# Patient Record
Sex: Male | Born: 1960 | Race: White | Hispanic: No | Marital: Married | Smoking: Never smoker
Health system: Southern US, Community
[De-identification: ages and names within clinical notes are randomized; demographics above are authoritative.]

## PROBLEM LIST (undated history)

## (undated) DIAGNOSIS — I10 Essential (primary) hypertension: Secondary | ICD-10-CM

## (undated) DIAGNOSIS — F329 Major depressive disorder, single episode, unspecified: Secondary | ICD-10-CM

## (undated) DIAGNOSIS — F32A Depression, unspecified: Secondary | ICD-10-CM

## (undated) DIAGNOSIS — E785 Hyperlipidemia, unspecified: Secondary | ICD-10-CM

## (undated) DIAGNOSIS — E119 Type 2 diabetes mellitus without complications: Secondary | ICD-10-CM

## (undated) DIAGNOSIS — I219 Acute myocardial infarction, unspecified: Secondary | ICD-10-CM

## (undated) DIAGNOSIS — F419 Anxiety disorder, unspecified: Secondary | ICD-10-CM

## (undated) HISTORY — DX: Depression, unspecified: F32.A

## (undated) HISTORY — DX: Hyperlipidemia, unspecified: E78.5

## (undated) HISTORY — DX: Type 2 diabetes mellitus without complications: E11.9

## (undated) HISTORY — PX: CORONARY ANGIOPLASTY WITH STENT PLACEMENT: SHX49

## (undated) HISTORY — DX: Acute myocardial infarction, unspecified: I21.9

## (undated) HISTORY — DX: Anxiety disorder, unspecified: F41.9

## (undated) HISTORY — DX: Essential (primary) hypertension: I10

## (undated) HISTORY — PX: TONSILLECTOMY: SHX5217

---
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
## 2018-01-23 LAB — HEMOGLOBIN A1C: Hemoglobin A1C: 9.7

## 2018-01-30 ENCOUNTER — Encounter: Payer: Self-pay | Admitting: Gastroenterology

## 2018-03-03 ENCOUNTER — Ambulatory Visit (INDEPENDENT_AMBULATORY_CARE_PROVIDER_SITE_OTHER): Payer: Self-pay

## 2018-03-03 DIAGNOSIS — Z1211 Encounter for screening for malignant neoplasm of colon: Secondary | ICD-10-CM

## 2018-03-03 MED ORDER — NA SULFATE-K SULFATE-MG SULF 17.5-3.13-1.6 GM/177ML PO SOLN: 1.0000 | ORAL | 0 refills | Status: DC

## 2018-03-03 NOTE — Patient Instructions (Addendum)
Matthew Lawson  17-Dec-1960 MRN: [id number]     Procedure Date: 07/29/2018 Time to register: 6:30am Place to register: [facility] Procedure Time: 7:30am Scheduled provider: R. Garfield Cornea, MD    PREPARATION FOR COLONOSCOPY WITH SUPREP BOWEL PREP KIT  Note: Suprep Bowel Prep Kit is a split-dose (2day) regimen. Consumption of BOTH 6-ounce bottles is required for a complete prep.  Please notify us immediately if you are diabetic, take iron supplements, or if you are on Coumadin or any other blood thinners.  Please hold the following medications: I will mail you a letter with this information                                                                                                                                                   2 DAYS BEFORE PROCEDURE:  DATE: 07/27/2018   DAY: Monday Begin clear liquid diet AFTER your lunch meal. NO SOLID FOODS after this point.  1 DAY BEFORE PROCEDURE:  DATE: 07/28/2018  DAY: Tuesday Continue clear liquids the entire day - NO SOLID FOOD.   Diabetic medications adjustments for today:   On 07/28/2018- only take half your normal dosage of Tresiba  At 6:00pm: Complete steps 1 through 4 below, using ONE (1) 6-ounce bottle, before going to bed. Step 1:  Pour ONE (1) 6-ounce bottle of SUPREP liquid into the mixing container.  Step 2:  Add cool drinking water to the 16 ounce line on the container and mix.  Note: Dilute the solution concentrate as directed prior to use. Step 3:  DRINK ALL the liquid in the container. Step 4:  You MUST drink an additional two (2) or more 16 ounce containers of water over the next one (1) hour.   Continue clear liquids.  DAY OF PROCEDURE:   DATE: 07/29/2018   DAY: Wednesday If you take medications for your heart, blood pressure, or breathing, you may take these medications.  Diabetic medications adjustments for today: On 07/29/2018- do not take any diabetes medication the morning of your colonoscopy.  5 hours before  your procedure at :2:30am Step 1:  Pour ONE (1) 6-ounce bottle of SUPREP liquid into the mixing container.  Step 2:  Add cool drinking water to the 16 ounce line on the container and mix.  Note: Dilute the solution concentrate as directed prior to use. Step 3:  DRINK ALL the liquid in the container. Step 4:  You MUST drink an additional two (2) or more 16 ounce containers of water over the next one (1) hour. You MUST complete the final glass of water at least 3 hours before your colonoscopy.   Nothing by mouth past 4:30am  You may take your morning medications with sip of water unless we have instructed otherwise.    Please see below for Dietary Information.  CLEAR LIQUIDS INCLUDE:  Water Jello (NOT red in color)   Ice Popsicles (NOT red in color)   Tea (sugar ok, no milk/cream) Powdered fruit flavored drinks  Coffee (sugar ok, no milk/cream) Gatorade/ Lemonade/ Kool-Aid  (NOT red in color)   Juice: apple, white grape, white cranberry Soft drinks  Clear bullion, consomme, broth (fat free beef/chicken/vegetable)  Carbonated beverages (any kind)  Strained chicken noodle soup Hard Candy   Remember: Clear liquids are liquids that will allow you to see your fingers on the other side of a clear glass. Be sure liquids are NOT red in color, and not cloudy, but CLEAR.  DO NOT EAT OR DRINK ANY OF THE FOLLOWING:  Dairy products of any kind   Cranberry juice Tomato juice / V8 juice   Grapefruit juice Orange juice     Red grape juice  Do not eat any solid foods, including such foods as: cereal, oatmeal, yogurt, fruits, vegetables, creamed soups, eggs, bread, crackers, pureed foods in a blender, etc.   HELPFUL HINTS FOR DRINKING PREP SOLUTION:   Make sure prep is extremely cold. Mix and refrigerate the the morning of the prep. You may also put in the freezer.   You may try mixing some Crystal Light or Country Time Lemonade if you prefer. Mix in small amounts; add more if necessary.  Try  drinking through a straw  Rinse mouth with water or a mouthwash between glasses, to remove after-taste.  Try sipping on a cold beverage /ice/ popsicles between glasses of prep.  Place a piece of sugar-free hard candy in mouth between glasses.  If you become nauseated, try consuming smaller amounts, or stretch out the time between glasses. Stop for 30-60 minutes, then slowly start back drinking.     OTHER INSTRUCTIONS  You will need a responsible adult at least 56 years of age to accompany you and drive you home. This person must remain in the waiting room during your procedure. The hospital will cancel your procedure if you do not have a responsible adult with you.   1. Wear loose fitting clothing that is easily removed. 2. Leave jewelry and other valuables at home.  3. Remove all body piercing jewelry and leave at home. 4. Total time from sign-in until discharge is approximately 2-3 hours. 5. You should go home directly after your procedure and rest. You can resume normal activities the day after your procedure. 6. The day of your procedure you should not:  Drive  Make legal decisions  Operate machinery  Drink alcohol  Return to work   You may call the office (Dept: [phone]) before 5:00pm, or page the doctor on call [phone]) after 5:00pm, for further instructions, if necessary.   Insurance Information YOU WILL NEED TO CHECK WITH YOUR INSURANCE COMPANY FOR THE BENEFITS OF COVERAGE YOU HAVE FOR THIS PROCEDURE.  UNFORTUNATELY, NOT ALL INSURANCE COMPANIES HAVE BENEFITS TO COVER ALL OR PART OF THESE TYPES OF PROCEDURES.  IT IS YOUR RESPONSIBILITY TO CHECK YOUR BENEFITS, HOWEVER, WE WILL BE GLAD TO ASSIST YOU WITH ANY CODES YOUR INSURANCE COMPANY MAY NEED.    PLEASE NOTE THAT MOST INSURANCE COMPANIES WILL NOT COVER A SCREENING COLONOSCOPY FOR PEOPLE UNDER THE AGE OF 50  IF YOU HAVE BCBS INSURANCE, YOU MAY HAVE BENEFITS FOR A SCREENING COLONOSCOPY BUT IF POLYPS ARE  FOUND THE DIAGNOSIS WILL CHANGE AND THEN YOU MAY HAVE A DEDUCTIBLE THAT WILL NEED TO BE MET. SO PLEASE MAKE SURE YOU CHECK YOUR BENEFITS FOR A SCREENING COLONOSCOPY AS WELL  AS A DIAGNOSTIC COLONOSCOPY.

## 2018-03-03 NOTE — Progress Notes (Signed)
Gastroenterology Pre-Procedure Review  Request Date:03/03/18 Requesting Physician: [facility]- Pushpinder Delane Ginger NP- no previous tcs  PATIENT REVIEW QUESTIONS: The patient responded to the following health history questions as indicated:    1. Diabetes Melitis: yes, glipizide qd, metformin bid, tresiba qhs, steglatro qd 2. Joint replacements in the past 12 months: no 3. Major health problems in the past 3 months: no 4. Has an artificial valve or MVP: no 5. Has a defibrillator: no 6. Has been advised in past to take antibiotics in advance of a procedure like teeth cleaning: no 7. Family history of colon cancer: no  8. Alcohol Use: no 9. History of sleep apnea: no  10. History of coronary artery or other vascular stents placed within the last 12 months: no, pt had a stent placed 18 months ago and stopped Brilinta 3 months ago.  11. History of any prior anesthesia complications: no    MEDICATIONS & ALLERGIES:    Patient reports the following regarding taking any blood thinners:   Plavix? no Aspirin? yes (81mg ) Coumadin? no Brilinta? no Xarelto? no Eliquis? no Pradaxa? no Savaysa? no Effient? no  Patient confirms/reports the following medications:  Current Outpatient Medications  Medication Sig Dispense Refill  . amLODipine (NORVASC) 5 MG tablet daily.    Marland Kitchen aspirin EC 81 MG tablet Take 81 mg by mouth daily.    Marland Kitchen atorvastatin (LIPITOR) 40 MG tablet at bedtime.    . Cholecalciferol (VITAMIN D3) 10 MCG (400 UNIT) CAPS Take by mouth.    . escitalopram (LEXAPRO) 20 MG tablet daily.    Marland Kitchen glipiZIDE (GLUCOTROL XL) 10 MG 24 hr tablet daily.    . Magnesium 500 MG CAPS Take by mouth daily.    . metFORMIN (GLUCOPHAGE) 1000 MG tablet 2 (two) times daily.    . metoprolol tartrate (LOPRESSOR) 25 MG tablet 12.5 mg 2 (two) times daily.    Marland Kitchen STEGLATRO 5 MG TABS daily.    Evaristo Bury FLEXTOUCH 200 UNIT/ML SOPN 24 Units at bedtime.     No current facility-administered medications for this  visit.     Patient confirms/reports the following allergies:  No Known Allergies  No orders of the defined types were placed in this encounter.   AUTHORIZATION INFORMATION Primary Insurance: [place],  [place] #: [id number] Pre-Cert / Berkley Harvey required: no   SCHEDULE INFORMATION: Procedure has been scheduled as follows:  Date: 05/20/18, Time: 7:30 Location: [facility]   This Gastroenterology Pre-Precedure Review Form is being routed to the following provider(s): Lewie Loron NP

## 2018-03-04 NOTE — Addendum Note (Signed)
Addended by: Myra Rude on: 03/04/2018 01:56 PM   Modules accepted: Orders, SmartSet

## 2018-03-09 NOTE — Progress Notes (Signed)
Take 1/2 dose of Tresiba evening prior to colonoscopy. No glipizide, metformin, or steglatro day of procedure.

## 2018-03-10 NOTE — Progress Notes (Signed)
Letter mailed to the pt with DM instructions 

## 2018-05-05 NOTE — Progress Notes (Signed)
Pt's procedure was re-scheduled to 07/29/2018 due to COVID 19.  Pt aware that we are mailing out new instructions including any diabetic medication adjustments.  Eber Jones in Endo notified.

## 2018-06-29 LAB — HEMOGLOBIN A1C: Hemoglobin A1C: 10

## 2018-07-20 ENCOUNTER — Telehealth: Payer: Self-pay | Admitting: *Deleted

## 2018-07-20 NOTE — Telephone Encounter (Addendum)
Called pt and left voice mail on work number for pt to call us back so we can schedule his COVID 19 screening.  VM is not set up on his home number.

## 2018-07-21 NOTE — Telephone Encounter (Addendum)
Lmom on his work number for pt to call me back today.  VM is not set up on his home phone.

## 2018-07-22 NOTE — Telephone Encounter (Signed)
Called pt's home number but there is no vm set up so unable to leave a message.  Left another vm on pt's work phone number for pt to call me back ASAP.

## 2018-07-23 ENCOUNTER — Telehealth: Payer: Self-pay | Admitting: *Deleted

## 2018-07-23 NOTE — Telephone Encounter (Signed)
Left message with sister for pt to call us back.  Trying to schedule pt for his COVID 19 screening.

## 2018-07-23 NOTE — Telephone Encounter (Signed)
Called pt's home phone number again and left a message with a gentleman for pt to call us back today.

## 2018-07-27 ENCOUNTER — Telehealth: Payer: Self-pay | Admitting: Gastroenterology

## 2018-07-27 NOTE — Telephone Encounter (Signed)
Called endo and cancelled procedure. fowarding to [facility]

## 2018-07-27 NOTE — Telephone Encounter (Signed)
[phone]  PATIENT WIFE CALLED TO CANCEL HIS PROCEDURE, HE WILL CALL BACK AFTER HIS WORK SCHEDULE GETS BETTER

## 2018-07-29 ENCOUNTER — Ambulatory Visit (HOSPITAL_COMMUNITY)
Admission: RE | Admit: 2018-07-29 | Payer: BC Managed Care – PPO | Source: Home / Self Care | Admitting: Internal Medicine

## 2018-07-29 ENCOUNTER — Encounter (HOSPITAL_COMMUNITY): Admission: RE | Payer: Self-pay | Source: Home / Self Care

## 2018-07-29 SURGERY — COLONOSCOPY
Anesthesia: Moderate Sedation

## 2018-08-03 NOTE — Telephone Encounter (Signed)
Noted  

## 2018-09-07 ENCOUNTER — Ambulatory Visit (INDEPENDENT_AMBULATORY_CARE_PROVIDER_SITE_OTHER): Payer: BC Managed Care – PPO | Admitting: "Endocrinology

## 2018-09-07 ENCOUNTER — Encounter: Payer: Self-pay | Admitting: "Endocrinology

## 2018-09-07 ENCOUNTER — Other Ambulatory Visit: Payer: Self-pay

## 2018-09-07 VITALS — BP 116/77 | HR 64 | Ht 68.0 in | Wt 177.0 lb

## 2018-09-07 DIAGNOSIS — I10 Essential (primary) hypertension: Secondary | ICD-10-CM

## 2018-09-07 DIAGNOSIS — E1159 Type 2 diabetes mellitus with other circulatory complications: Secondary | ICD-10-CM

## 2018-09-07 DIAGNOSIS — E782 Mixed hyperlipidemia: Secondary | ICD-10-CM | POA: Diagnosis not present

## 2018-09-07 NOTE — Patient Instructions (Signed)

## 2018-09-07 NOTE — Progress Notes (Signed)
Endocrinology Consult Note       09/07/2018, 5:50 PM   Subjective:    Patient ID: Matthew Lawson, male    DOB: 02/22/1960.  Matthew Lawson is being seen in consultation for management of currently uncontrolled symptomatic diabetes requested by  Sander Radon, NP.   Past Medical History:  Diagnosis Date  . Anxiety   . Depression   . Diabetes mellitus, type II ([place])   . Heart attack ([place])   . Hyperlipidemia   . Hypertension     Past Surgical History:  Procedure Laterality Date  . CORONARY ANGIOPLASTY WITH STENT PLACEMENT    . TONSILLECTOMY      Social History   Socioeconomic History  . Marital status: Married    Spouse name: Not on file  . Number of children: Not on file  . Years of education: Not on file  . Highest education level: Not on file  Occupational History  . Not on file  Social Needs  . Financial resource strain: Not on file  . Food insecurity    Worry: Not on file    Inability: Not on file  . Transportation needs    Medical: Not on file    Non-medical: Not on file  Tobacco Use  . Smoking status: Never Smoker  . Smokeless tobacco: Never Used  Substance and Sexual Activity  . Alcohol use: Never    Frequency: Never  . Drug use: Never  . Sexual activity: Not on file  Lifestyle  . Physical activity    Days per week: Not on file    Minutes per session: Not on file  . Stress: Not on file  Relationships  . Social Herbalist on phone: Not on file    Gets together: Not on file    Attends religious service: Not on file    Active member of club or organization: Not on file    Attends meetings of clubs or organizations: Not on file    Relationship status: Not on file  Other Topics Concern  . Not on file  Social History Narrative  . Not on file    Family History  Problem Relation Age of Onset  . Diabetes Mother   . Hypertension Mother   . Hyperlipidemia  Mother   . Hypertension Father   . Diabetes Father   . Hyperlipidemia Father   . CAD Father     Outpatient Encounter Medications as of 09/07/2018  Medication Sig  . amLODipine (NORVASC) 5 MG tablet daily.  Marland Kitchen aspirin EC 81 MG tablet Take 81 mg by mouth daily.  Marland Kitchen atorvastatin (LIPITOR) 40 MG tablet at bedtime.  . Cholecalciferol (VITAMIN D3) 10 MCG (400 UNIT) CAPS Take by mouth.  . Magnesium 500 MG CAPS Take by mouth daily.  . metFORMIN (GLUCOPHAGE) 1000 MG tablet 2 (two) times daily.  . metoprolol tartrate (LOPRESSOR) 25 MG tablet 12.5 mg 2 (two) times daily.  . TRESIBA FLEXTOUCH 200 UNIT/ML SOPN Inject 30 Units into the skin at bedtime.  . [DISCONTINUED] glipiZIDE (GLUCOTROL XL) 10 MG 24 hr tablet daily.  . [DISCONTINUED] STEGLATRO 5 MG TABS  daily.  . [DISCONTINUED] escitalopram (LEXAPRO) 20 MG tablet daily.  . [DISCONTINUED] Na Sulfate-K Sulfate-Mg Sulf (SUPREP BOWEL PREP KIT) 17.5-3.13-1.6 GM/177ML SOLN Take 1 kit by mouth as directed.   No facility-administered encounter medications on file as of 09/07/2018.     ALLERGIES: No Known Allergies  VACCINATION STATUS:  There is no immunization history on file for this patient.  Diabetes He presents for his initial diabetic visit. He has type 2 diabetes mellitus. Onset time: He was diagnosed at approximate age of 43 years. His disease course has been worsening. Hypoglycemia symptoms include nervousness/anxiousness, sweats and tremors. Pertinent negatives for hypoglycemia include no confusion, headaches, pallor or seizures. Associated symptoms include blurred vision, polydipsia, polyuria and weight loss. Pertinent negatives for diabetes include no chest pain, no fatigue, no polyphagia and no weakness. Hypoglycemia complications include nocturnal hypoglycemia. Symptoms are worsening. Diabetic complications include heart disease. Risk factors for coronary artery disease include diabetes mellitus, dyslipidemia, family history, hypertension and  sedentary lifestyle. Current diabetic treatments: He is currently on Tresiba 24 units nightly, metformin 1000 mg p.o. twice daily, glipizide 10 mg p.o. daily, andSteglatro 5 mg p.o. daily. His weight is decreasing steadily. He is following a generally unhealthy diet. When asked about meal planning, he reported none. He has not had a previous visit with a dietitian. He never participates in exercise. (He did not bring any logs nor meter to review.  He reports significant fluctuation in his blood glucose profile including hypoglycemia.  His most recent A1c was 10% on June 29, 2018. ) An ACE inhibitor/angiotensin II receptor blocker is not being taken. Eye exam is current.  Hyperlipidemia This is a chronic problem. The current episode started more than 1 year ago. Exacerbating diseases include diabetes. Pertinent negatives include no chest pain, myalgias or shortness of breath. Current antihyperlipidemic treatment includes statins. Risk factors for coronary artery disease include diabetes mellitus, dyslipidemia, hypertension, male sex and a sedentary lifestyle.  Hypertension This is a chronic problem. The current episode started more than 1 year ago. Associated symptoms include blurred vision and sweats. Pertinent negatives include no chest pain, headaches, neck pain, palpitations or shortness of breath. Risk factors for coronary artery disease include diabetes mellitus, dyslipidemia, male gender, sedentary lifestyle and family history. Past treatments include calcium channel blockers. Hypertensive end-organ damage includes CAD/MI.     Review of Systems  Constitutional: Positive for weight loss. Negative for chills, fatigue, fever and unexpected weight change.  HENT: Negative for dental problem, mouth sores and trouble swallowing.   Eyes: Positive for blurred vision. Negative for visual disturbance.  Respiratory: Negative for cough, choking, chest tightness, shortness of breath and wheezing.    Cardiovascular: Negative for chest pain, palpitations and leg swelling.  Gastrointestinal: Negative for abdominal distention, abdominal pain, constipation, diarrhea, nausea and vomiting.  Endocrine: Positive for polydipsia and polyuria. Negative for polyphagia.  Genitourinary: Negative for dysuria, flank pain, hematuria and urgency.  Musculoskeletal: Negative for back pain, gait problem, myalgias and neck pain.  Skin: Negative for pallor, rash and wound.  Neurological: Positive for tremors. Negative for seizures, syncope, weakness, numbness and headaches.  Psychiatric/Behavioral: Negative for confusion and dysphoric mood. The patient is nervous/anxious.     Objective:    BP 116/77   Pulse 64   Ht '5\' 8"'$  (1.727 m)   Wt 177 lb (80.3 kg)   BMI 26.91 kg/m   Wt Readings from Last 3 Encounters:  09/07/18 177 lb (80.3 kg)     Physical Exam Constitutional:  General: He is not in acute distress.    Appearance: He is well-developed.  HENT:     Head: Normocephalic and atraumatic.  Neck:     Musculoskeletal: Normal range of motion and neck supple.     Thyroid: No thyromegaly.     Trachea: No tracheal deviation.  Cardiovascular:     Rate and Rhythm: Normal rate.     Pulses:          Dorsalis pedis pulses are 1+ on the right side and 1+ on the left side.       Posterior tibial pulses are 1+ on the right side and 1+ on the left side.     Heart sounds: S1 normal and S2 normal. No murmur. No gallop.   Pulmonary:     Effort: Pulmonary effort is normal. No respiratory distress.     Breath sounds: No wheezing.  Abdominal:     General: There is no distension.     Tenderness: There is no abdominal tenderness. There is no guarding.  Musculoskeletal:     Right shoulder: He exhibits no swelling and no deformity.  Skin:    General: Skin is warm and dry.     Findings: No rash.     Nails: There is no clubbing.   Neurological:     Mental Status: He is alert and oriented to person, place,  and time.     Cranial Nerves: No cranial nerve deficit.     Sensory: No sensory deficit.     Gait: Gait normal.     Deep Tendon Reflexes: Reflexes are normal and symmetric.  Psychiatric:        Speech: Speech normal.        Behavior: Behavior normal. Behavior is cooperative.        Thought Content: Thought content normal.        Judgment: Judgment normal.     Recent Results (from the past 2160 hour(s))  Hemoglobin A1c     Status: None   Collection Time: 06/29/18 12:00 AM  Result Value Ref Range   Hemoglobin A1C 10      Assessment & Plan:   1. DM type 2 causing vascular disease ([place])  - Matthew Lawson has currently uncontrolled symptomatic type 2 DM since  58 years of age,  with most recent A1c of 10 %. Recent labs reviewed. - I had a long discussion with him about the progressive nature of diabetes and the pathology behind its complications. -his diabetes is complicated by CAD and he remains at a high risk for more acute and chronic complications which include CAD, CVA, CKD, retinopathy, and neuropathy. These are all discussed in detail with him.  - I have counseled him on diet management adopting a carbohydrate restricted/protein rich diet. - he admits that there is a room for improvement in his food and drink choices. - Suggestion is made for him to avoid simple carbohydrates  from his diet including Cakes, Sweet Desserts, Ice Cream, Soda (diet and regular), Sweet Tea, Candies, Chips, Cookies, Store Bought Juices, Alcohol in Excess of  1-2 drinks a day, Artificial Sweeteners,  Coffee Creamer, and "Sugar-free" Products. This will help patient to have more stable blood glucose profile and potentially avoid unintended weight gain.  - I encouraged him to switch to  unprocessed or minimally processed complex starch and increased protein intake (animal or plant source), fruits, and vegetables.  - he is advised to stick to a routine mealtimes to eat 3 meals  a day and avoid unnecessary  snacks ( to snack only to correct hypoglycemia).   - he will be scheduled with Jearld Fenton, RDN, CDE for diabetes education.  - I have approached him with the following individualized plan to manage  his diabetes and patient agrees:   -Patient with questionable cognitive ability, on a particularly complicated insulin and oral medication regimen, at risk for hypoglycemia from inadvertent use. -I discussed and increased his Tresiba to 30 units nightly,  associated with strict monitoring of glucose 4 times a day-before meals and at bedtime. - he is warned not to take insulin without proper monitoring per orders.  - he is encouraged to call clinic for blood glucose levels less than 70 or above 300 mg /dl. - he is advised to continue metformin 1000 mg p.o. twice daily, therapeutically suitable for patient . - his Steglatro will be discontinued, given his unexplained significant weight loss from 220 pounds to 177 pounds, decrease his risk of euglycemic DKA.    -His report of hypoglycemia significant, will be taken off of glipizide for now. -He is not a suitable candidate for incretin therapy.  - Patient specific target  A1c;  LDL, HDL, Triglycerides, and  Waist Circumference were discussed in detail.  2) Blood Pressure /Hypertension:  his blood pressure is  controlled to target.   he is advised to continue his current medications including amlodipine 5 mg p.o. daily with breakfast , and metoprolol 25 mg p.o. twice daily. 3) Lipids/Hyperlipidemia: No recent lipid panel to review.  He is on atorvastatin 40 mg p.o. nightly.  he  is advised to continue , Side effects and precautions discussed with him.  4)  Weight/Diet:  Body mass index is 26.91 kg/m.  -He is not a candidate for major weight loss measures.  He will benefit from diabetes education.  CDE Consult will be initiated . Exercise, and detailed carbohydrates information provided  -  detailed on discharge instructions.  5) Chronic  Care/Health Maintenance:  -he  is on Statin medications and  is encouraged to initiate and continue to follow up with Ophthalmology, Dentist,  Podiatrist at least yearly or according to recommendations, and advised to  stay away from smoking. I have recommended yearly flu vaccine and pneumonia vaccine at least every 5 years; moderate intensity exercise for up to 150 minutes weekly; and  sleep for at least 7 hours a day.  - he is  advised to maintain close follow up with Sander Radon, NP for primary care needs, as well as his other providers for optimal and coordinated care.  - Time spent with the patient: 45 minutes, of which >50% was spent in obtaining information about his symptoms, reviewing his previous labs/studies, evaluations, and treatments, counseling him about his currently uncontrolled type 2 diabetes, hyperlipidemia, hypertension, and developing plans for long term treatment based on the latest standards of care/guidelines.  Please refer to " Patient Self Inventory" in the Media  tab for reviewed elements of pertinent patient history.  Matthew Lawson participated in the discussions, expressed understanding, and voiced agreement with the above plans.  All questions were answered to his satisfaction. he is encouraged to contact clinic should he have any questions or concerns prior to his return visit.  Follow up plan: - Return in about 10 days (around 09/17/2018), or in person if possible, for Follow up with Meter and Logs Only - no Labs.  Glade Lloyd, MD [facility] [address]  [place], [place] [address] Phone: [phone]  Fax: [phone]    09/07/2018, 5:50 PM  This note was partially dictated with voice recognition software. Similar sounding words can be transcribed inadequately or may not  be corrected upon review.

## 2018-09-14 ENCOUNTER — Other Ambulatory Visit: Payer: Self-pay | Admitting: "Endocrinology

## 2018-09-14 ENCOUNTER — Telehealth: Payer: Self-pay | Admitting: "Endocrinology

## 2018-09-14 MED ORDER — GLIPIZIDE ER 5 MG PO TB24: 5.0000 mg | ORAL_TABLET | Freq: Every day | ORAL | 3 refills | Status: DC

## 2018-09-14 NOTE — Telephone Encounter (Signed)
Advise to increase tresiba to 40 units qhs, continue MTF 1000 mg po BID, resuming glipizide 5 mg XL daily at [facility] ( will send in a rx).

## 2018-09-14 NOTE — Telephone Encounter (Signed)
Patient's sister, Amy called back and said his reading  Saturday morning it was 212 Saturday before lunch it was 254 Saturday before supper it was 237 Saturday night it was 498.   Yesterday morning it was 145, before lunch it was 260 and before supper it was 261. At bedtime it was 315.

## 2018-09-14 NOTE — Telephone Encounter (Signed)
Lft msg with both pt and sister to call back

## 2018-09-14 NOTE — Telephone Encounter (Signed)
Pt's sister notified

## 2018-09-14 NOTE — Telephone Encounter (Signed)
Patient's sister called and left Vm that he is having readings over 200. She said that one time it was over 300 & another time it was over 400. She can be reached at [phone]

## 2018-09-17 ENCOUNTER — Other Ambulatory Visit: Payer: Self-pay

## 2018-09-17 ENCOUNTER — Ambulatory Visit (INDEPENDENT_AMBULATORY_CARE_PROVIDER_SITE_OTHER): Payer: BC Managed Care – PPO | Admitting: "Endocrinology

## 2018-09-17 ENCOUNTER — Encounter: Payer: Self-pay | Admitting: "Endocrinology

## 2018-09-17 DIAGNOSIS — I10 Essential (primary) hypertension: Secondary | ICD-10-CM | POA: Diagnosis not present

## 2018-09-17 DIAGNOSIS — E1159 Type 2 diabetes mellitus with other circulatory complications: Secondary | ICD-10-CM | POA: Diagnosis not present

## 2018-09-17 DIAGNOSIS — E782 Mixed hyperlipidemia: Secondary | ICD-10-CM

## 2018-09-17 NOTE — Progress Notes (Signed)
09/17/2018, 5:27 PM                                                    Endocrinology Telehealth Visit Follow up Note -During COVID -19 Pandemic  This visit type was conducted due to national recommendations for restrictions regarding the COVID-19 Pandemic  in an effort to limit this patient's exposure and mitigate transmission of the corona virus.  Due to his co-morbid illnesses, Matthew Lawson is at  moderate to high risk for complications without adequate follow up.  This format is felt to be most appropriate for him at this time.  I connected with this patient on 09/17/2018   by telephone and verified that I am speaking with the correct person using two identifiers. Matthew Lawson, 08/22/1960. he has verbally consented to this visit. All issues noted in this document were discussed and addressed. The format was not optimal for physical exam.    Subjective:    Patient ID: Matthew Lawson, male    DOB: 02/10/1960.  Matthew Lawson is being engaged in telehealth via telephone for management of currently uncontrolled symptomatic diabetes requested by  Alisia FerrariGill, Pushpinder K, NP.   Past Medical History:  Diagnosis Date  . Anxiety   . Depression   . Diabetes mellitus, type II (HCC)   . Heart attack (HCC)   . Hyperlipidemia   . Hypertension     Past Surgical History:  Procedure Laterality Date  . CORONARY ANGIOPLASTY WITH STENT PLACEMENT    . TONSILLECTOMY      Social History   Socioeconomic History  . Marital status: Married    Spouse name: Not on file  . Number of children: Not on file  . Years of education: Not on file  . Highest education level: Not on file  Occupational History  . Not on file  Social Needs  . Financial resource strain: Not on file  . Food insecurity    Worry: Not on file    Inability: Not on file  . Transportation needs    Medical: Not on file    Non-medical: Not on file  Tobacco Use  .  Smoking status: Never Smoker  . Smokeless tobacco: Never Used  Substance and Sexual Activity  . Alcohol use: Never    Frequency: Never  . Drug use: Never  . Sexual activity: Not on file  Lifestyle  . Physical activity    Days per week: Not on file    Minutes per session: Not on file  . Stress: Not on file  Relationships  . Social Musicianconnections    Talks on phone: Not on file    Gets together: Not on file    Attends religious service: Not on file    Active member of club or organization: Not on file    Attends meetings of clubs or organizations: Not on file    Relationship status: Not on file  Other Topics Concern  . Not on file  Social History Narrative  . Not on file    Family History  Problem Relation Age of Onset  . Diabetes Mother   . Hypertension Mother   . Hyperlipidemia Mother   . Hypertension Father   . Diabetes Father   . Hyperlipidemia Father   . CAD Father     Outpatient Encounter Medications as of 09/17/2018  Medication Sig  . amLODipine (NORVASC) 5 MG tablet daily.  Marland Kitchen aspirin EC 81 MG tablet Take 81 mg by mouth daily.  Marland Kitchen atorvastatin (LIPITOR) 40 MG tablet at bedtime.  . Cholecalciferol (VITAMIN D3) 10 MCG (400 UNIT) CAPS Take by mouth.  Marland Kitchen glipiZIDE (GLUCOTROL XL) 5 MG 24 hr tablet Take 1 tablet (5 mg total) by mouth daily with breakfast.  . Magnesium 500 MG CAPS Take by mouth daily.  . metFORMIN (GLUCOPHAGE) 1000 MG tablet 2 (two) times daily.  . metoprolol tartrate (LOPRESSOR) 25 MG tablet 12.5 mg 2 (two) times daily.  . TRESIBA FLEXTOUCH 200 UNIT/ML SOPN Inject 30 Units into the skin at bedtime.   No facility-administered encounter medications on file as of 09/17/2018.     ALLERGIES: No Known Allergies  VACCINATION STATUS:  There is no immunization history on file for this patient.  Diabetes He presents for his follow-up diabetic visit. He has type 2 diabetes mellitus. Onset time: He was diagnosed at approximate age of 53 years. His disease  course has been improving. Hypoglycemia symptoms include nervousness/anxiousness, sweats and tremors. Pertinent negatives for hypoglycemia include no confusion, headaches, pallor or seizures. Associated symptoms include blurred vision, polydipsia, polyuria and weight loss. Pertinent negatives for diabetes include no chest pain, no fatigue, no polyphagia and no weakness. Hypoglycemia complications include nocturnal hypoglycemia. Symptoms are improving. Diabetic complications include heart disease. Risk factors for coronary artery disease include diabetes mellitus, dyslipidemia, family history, hypertension and sedentary lifestyle. Current diabetic treatments: He is currently on Tresiba 40 units nightly, metformin 1000 mg p.o. twice daily, glipizide 5 mg p.o. daily. He is following a generally unhealthy diet. When asked about meal planning, he reported none. He has not had a previous visit with a dietitian. He never participates in exercise. His home blood glucose trend is decreasing steadily. His breakfast blood glucose range is generally 110-130 mg/dl. His lunch blood glucose range is generally 140-180 mg/dl. His dinner blood glucose range is generally 140-180 mg/dl. His bedtime blood glucose range is generally 140-180 mg/dl. His overall blood glucose range is 140-180 mg/dl. (He has seen significant improvement in his glycemic profile, including less frequent hypoglycemia.) An ACE inhibitor/angiotensin II receptor blocker is not being taken. Eye exam is current.  Hyperlipidemia This is a chronic problem. The current episode started more than 1 year ago. Exacerbating diseases include diabetes. Pertinent negatives include no chest pain, myalgias or shortness of breath. Current antihyperlipidemic treatment includes statins. Risk factors for coronary artery disease include diabetes mellitus, dyslipidemia, hypertension, male sex and a sedentary lifestyle.  Hypertension This is a chronic problem. The current episode  started more than 1 year ago. Associated symptoms include blurred vision and sweats. Pertinent negatives include no chest pain, headaches, neck pain, palpitations or shortness of breath. Risk factors for coronary artery disease include diabetes mellitus, dyslipidemia, male gender, sedentary lifestyle and family history. Past treatments include calcium channel blockers. Hypertensive end-organ damage includes CAD/MI.    Review of systems: Limited as above.   Objective:    There were no vitals taken for this visit.  Wt Readings from Last 3 Encounters:  09/07/18  177 lb (80.3 kg)       Recent Results (from the past 2160 hour(s))  Hemoglobin A1c     Status: None   Collection Time: 06/29/18 12:00 AM  Result Value Ref Range   Hemoglobin A1C 10      Assessment & Plan:   1. DM type 2 causing vascular disease (HCC)  - Aithen Bein has currently uncontrolled symptomatic type 2 DM since  58 years of age,  with most recent A1c of 10 %. Recent labs reviewed. -He is reporting new target glycemic profile, much less frequent hypoglycemia compared to prior to his last visit . this is despite his recent A1c of 10%. - I had a long discussion with him about the progressive nature of diabetes and the pathology behind its complications. -his diabetes is complicated by CAD and he remains at a high risk for more acute and chronic complications which include CAD, CVA, CKD, retinopathy, and neuropathy. These are all discussed in detail with him.  - I have counseled him on diet management adopting a carbohydrate restricted/protein rich diet. - he  admits there is a room for improvement in his diet and drink choices. -  Suggestion is made for him to avoid simple carbohydrates  from his diet including Cakes, Sweet Desserts / Pastries, Ice Cream, Soda (diet and regular), Sweet Tea, Candies, Chips, Cookies, Sweet Pastries,  Store Bought Juices, Alcohol in Excess of  1-2 drinks a day, Artificial Sweeteners, Coffee  Creamer, and "Sugar-free" Products. This will help patient to have stable blood glucose profile and potentially avoid unintended weight gain.   - I encouraged him to switch to  unprocessed or minimally processed complex starch and increased protein intake (animal or plant source), fruits, and vegetables.  - he is advised to stick to a routine mealtimes to eat 3 meals  a day and avoid unnecessary snacks ( to snack only to correct hypoglycemia).   - he will be scheduled with Norm Salt, RDN, CDE for diabetes education.  - I have approached him with the following individualized plan to manage  his diabetes and patient agrees:   -Patient with questionable cognitive ability, thyroid he would be to avoid hypoglycemia in this patient.   -She is anticipating a busy work Land, would benefit from simplified treatment regimen.    -I discussed increasing her Evaristo Bury to 30 units nightly,   associated with strict monitoring of glucose at least 2 times daily-before meals and at bedtime. - he is warned not to take insulin without proper monitoring per orders.  - he is encouraged to call clinic for blood glucose levels less than 70 or above 300 mg /dl. - he is advised to continue metformin 1000 mg p.o. twice daily, therapeutically suitable for patient . - his Steglatro has been discontinued, given his unexplained significant weight loss from 220 pounds to 177 pounds, to decrease his risk of euglycemic DKA.   -He will continue glipizide 5 mg XL daily at bedtime.   - Patient specific target  A1c;  LDL, HDL, Triglycerides, and  Waist Circumference were discussed in detail.  2) Blood Pressure /Hypertension: he is advised to home monitor blood pressure and report if > 140/90 on 2 separate readings.   he is advised to continue his current medications including amlodipine 5 mg p.o. daily with breakfast , and metoprolol 25 mg p.o. twice daily. 3) Lipids/Hyperlipidemia: No recent lipid panel to  review.  He is on atorvastatin 40 mg p.o. nightly.  he  is advised to continue , Side effects and precautions discussed with him.  4)  Weight/Diet:  -He is not a candidate for major weight loss measures.  He will benefit from diabetes education.  CDE Consult will be initiated . Exercise, and detailed carbohydrates information provided  -  detailed on discharge instructions.  5) Chronic Care/Health Maintenance:  -he  is on Statin medications and  is encouraged to initiate and continue to follow up with Ophthalmology, Dentist,  Podiatrist at least yearly or according to recommendations, and advised to  stay away from smoking. I have recommended yearly flu vaccine and pneumonia vaccine at least every 5 years; moderate intensity exercise for up to 150 minutes weekly; and  sleep for at least 7 hours a day.  - he is  advised to maintain close follow up with Alisia FerrariGill, Pushpinder K, NP for primary care needs, as well as his other providers for optimal and coordinated care.  - Patient Care Time Today:  25 min, of which >50% was spent in  counseling and the rest reviewing his  current and  previous labs/studies, previous treatments, his blood glucose readings, and medications' doses and developing a plan for long-term care based on the latest recommendations for standards of care.   Matthew Lawson participated in the discussions, expressed understanding, and voiced agreement with the above plans.  All questions were answered to his satisfaction. he is encouraged to contact clinic should he have any questions or concerns prior to his return visit.  Follow up plan: - Return in about 9 weeks (around 11/19/2018) for Bring Meter and Logs- A1c in Office.  Marquis LunchGebre Nida, MD [facility] [address] [place], [place] [address] Phone: [phone]  Fax: [phone]    09/17/2018, 5:27 PM  This note was partially dictated with voice recognition software. Similar  sounding words can be transcribed inadequately or may not  be corrected upon review.

## 2018-10-21 ENCOUNTER — Telehealth: Payer: Self-pay | Admitting: "Endocrinology

## 2018-10-21 NOTE — Telephone Encounter (Signed)
Keep all meds same, start monitoring glucose 4 times a day and report in 3 days.

## 2018-10-21 NOTE — Telephone Encounter (Signed)
Pt states ha has had high BG readings.   Date Before breakfast Before lunch Before supper Bedtime  9/27 124   400  9/28 107   380  9/29 88   396          Pt taking: Glipizide XL 5mg  qam       MTF 1000mg  bid       Tresiba 30 units qhs  A1c 10.7 10/14/2018 Pts sister staes that the pt wife has recently passed away and he has been having trouble since then.

## 2018-10-21 NOTE — Telephone Encounter (Signed)
Pts sister notified. Also advised her if pt needs more strips to let us know what type of meter he has and let us know so we can call in a refill for 4 x daily

## 2018-10-27 ENCOUNTER — Telehealth: Payer: Self-pay | Admitting: "Endocrinology

## 2018-10-27 NOTE — Telephone Encounter (Signed)
Advise to switch Tresiba 30 units to breakfast around 8AM, continue the other meds same.

## 2018-10-27 NOTE — Telephone Encounter (Signed)
Pt.notified

## 2018-10-27 NOTE — Telephone Encounter (Signed)
    Date Before breakfast Before lunch Before supper Bedtime  10/4 63 [phone]  10/5 79 [phone]                Pt Taking:  Glipizide XL 5mg  qam                  MTF 1000mg  bid                  Tresiba 30 units qhs

## 2018-11-04 NOTE — Telephone Encounter (Signed)
Advise him to increase his glipizide to 10mg  at breakfast ( 2 pills of 5 mg).

## 2018-11-04 NOTE — Telephone Encounter (Signed)
Pt.notified

## 2018-11-04 NOTE — Telephone Encounter (Signed)
Patient's sister called and said his readings are ::   10/7  Before breakfast 266 Before lunch 461 Did not take it before supper, at bedtime it was 328  10/8 before breakfast 114 Before lunch 369 Before supper 270 At bedtime 327  10/9 before breakfast 91 Before lunch 387 Before supper 451 At bedtime 389  10/10 before breakfast 148 Did not lunch or dinner At bedtime 304  10/11 before breakfast 102 Did not do lunch Before supper 346 At bedtime 241  10/12 before breakfast 129 Before lunch 295 Before supper 374 Did not check at bedtime  10/13 before breakfast 105 Before lunch 275 Before supper 208 Unsure of bedtime reading  Please call his sister back, [phone]

## 2018-11-17 ENCOUNTER — Other Ambulatory Visit: Payer: Self-pay | Admitting: "Endocrinology

## 2018-11-24 ENCOUNTER — Other Ambulatory Visit: Payer: Self-pay

## 2018-11-24 ENCOUNTER — Encounter: Payer: Self-pay | Admitting: "Endocrinology

## 2018-11-24 ENCOUNTER — Ambulatory Visit: Payer: BC Managed Care – PPO | Admitting: "Endocrinology

## 2018-11-24 VITALS — BP 129/73 | HR 72 | Ht 68.0 in | Wt 175.0 lb

## 2018-11-24 DIAGNOSIS — N529 Male erectile dysfunction, unspecified: Secondary | ICD-10-CM

## 2018-11-24 DIAGNOSIS — I10 Essential (primary) hypertension: Secondary | ICD-10-CM

## 2018-11-24 DIAGNOSIS — E782 Mixed hyperlipidemia: Secondary | ICD-10-CM | POA: Diagnosis not present

## 2018-11-24 DIAGNOSIS — E1159 Type 2 diabetes mellitus with other circulatory complications: Secondary | ICD-10-CM

## 2018-11-24 LAB — POCT GLYCOSYLATED HEMOGLOBIN (HGB A1C): Hemoglobin A1C: 11 % — AB (ref 4.0–5.6)

## 2018-11-24 MED ORDER — NOVOLOG MIX 70/30 FLEXPEN (70-30) 100 UNIT/ML ~~LOC~~ SUPN: 30.0000 [IU] | PEN_INJECTOR | Freq: Two times a day (BID) | SUBCUTANEOUS | 2 refills | Status: DC

## 2018-11-24 MED ORDER — TADALAFIL 5 MG PO TABS: 5.0000 mg | ORAL_TABLET | Freq: Every day | ORAL | 0 refills | Status: DC | PRN

## 2018-11-24 MED ORDER — GLIPIZIDE 5 MG PO TABS: 5.0000 mg | ORAL_TABLET | Freq: Two times a day (BID) | ORAL | 3 refills | Status: DC

## 2018-11-24 NOTE — Patient Instructions (Signed)

## 2018-11-24 NOTE — Progress Notes (Signed)
11/24/2018, 12:28 PM   Endocrinology follow-up note    Subjective:    Patient ID: Matthew Lawson, male    DOB: 1960-07-13.  Matthew Lawson is being seen in follow-up for management of currently uncontrolled symptomatic diabetes requested by  Alisia Ferrari, NP. He complains of erectile dysfunction.  Past Medical History:  Diagnosis Date  . Anxiety   . Depression   . Diabetes mellitus, type II (HCC)   . Heart attack (HCC)   . Hyperlipidemia   . Hypertension     Past Surgical History:  Procedure Laterality Date  . CORONARY ANGIOPLASTY WITH STENT PLACEMENT    . TONSILLECTOMY      Social History   Socioeconomic History  . Marital status: Married    Spouse name: Not on file  . Number of children: Not on file  . Years of education: Not on file  . Highest education level: Not on file  Occupational History  . Not on file  Social Needs  . Financial resource strain: Not on file  . Food insecurity    Worry: Not on file    Inability: Not on file  . Transportation needs    Medical: Not on file    Non-medical: Not on file  Tobacco Use  . Smoking status: Never Smoker  . Smokeless tobacco: Never Used  Substance and Sexual Activity  . Alcohol use: Never    Frequency: Never  . Drug use: Never  . Sexual activity: Not on file  Lifestyle  . Physical activity    Days per week: Not on file    Minutes per session: Not on file  . Stress: Not on file  Relationships  . Social Musician on phone: Not on file    Gets together: Not on file    Attends religious service: Not on file    Active member of club or organization: Not on file    Attends meetings of clubs or organizations: Not on file    Relationship status: Not on file  Other Topics Concern  . Not on file  Social History Narrative  . Not on file    Family History  Problem Relation Age of Onset  . Diabetes Mother   .  Hypertension Mother   . Hyperlipidemia Mother   . Hypertension Father   . Diabetes Father   . Hyperlipidemia Father   . CAD Father     Outpatient Encounter Medications as of 11/24/2018  Medication Sig  . amLODipine (NORVASC) 5 MG tablet daily.  Marland Kitchen aspirin EC 81 MG tablet Take 81 mg by mouth daily.  Marland Kitchen atorvastatin (LIPITOR) 40 MG tablet at bedtime.  . Cholecalciferol (VITAMIN D3) 10 MCG (400 UNIT) CAPS Take by mouth.  Marland Kitchen glipiZIDE (GLUCOTROL) 5 MG tablet Take 1 tablet (5 mg total) by mouth 2 (two) times daily before a meal.  . insulin aspart protamine - aspart (NOVOLOG MIX 70/30 FLEXPEN) (70-30) 100 UNIT/ML FlexPen Inject 0.3 mLs (30 Units total) into the skin 2 (two) times daily.  . Magnesium 500 MG CAPS Take by mouth daily.  . metFORMIN (GLUCOPHAGE)  1000 MG tablet 2 (two) times daily.  . tadalafil (CIALIS) 5 MG tablet Take 1 tablet (5 mg total) by mouth daily as needed for erectile dysfunction.  . [DISCONTINUED] glipiZIDE (GLUCOTROL) 10 MG tablet Take 1 tablet (10 mg total) by mouth 2 (two) times daily before a meal.  . [DISCONTINUED] metoprolol tartrate (LOPRESSOR) 25 MG tablet 12.5 mg 2 (two) times daily.  . [DISCONTINUED] TRESIBA FLEXTOUCH 200 UNIT/ML SOPN Inject 40 Units into the skin every morning.   No facility-administered encounter medications on file as of 11/24/2018.     ALLERGIES: No Known Allergies  VACCINATION STATUS:  There is no immunization history on file for this patient.  Diabetes He presents for his follow-up diabetic visit. He has type 2 diabetes mellitus. Onset time: He was diagnosed at approximate age of 52 years. His disease course has been worsening. Hypoglycemia symptoms include nervousness/anxiousness, sweats and tremors. Pertinent negatives for hypoglycemia include no confusion, headaches, pallor or seizures. Associated symptoms include blurred vision, polydipsia, polyuria and weight loss. Pertinent negatives for diabetes include no chest pain, no fatigue,  no polyphagia and no weakness. Hypoglycemia complications include nocturnal hypoglycemia. Symptoms are worsening. Diabetic complications include heart disease. Risk factors for coronary artery disease include diabetes mellitus, dyslipidemia, family history, hypertension and sedentary lifestyle. Current diabetic treatments: He is currently on Tresiba 40 units nightly, metformin 1000 mg p.o. twice daily, glipizide 5 mg p.o. daily. His weight is fluctuating minimally. He is following a generally unhealthy diet. When asked about meal planning, he reported none. He has not had a previous visit with a dietitian. He never participates in exercise. His home blood glucose trend is decreasing steadily. His breakfast blood glucose range is generally 130-140 mg/dl. His lunch blood glucose range is generally >200 mg/dl. His dinner blood glucose range is generally >200 mg/dl. His bedtime blood glucose range is generally >200 mg/dl. His overall blood glucose range is >200 mg/dl. (His fasting glycemic profile is near target, however he is running persistently hyperglycemic readings postprandial.  He had exposure to steroids at least on one occasion since..  His point-of-care A1c today of 11%  ) An ACE inhibitor/angiotensin II receptor blocker is not being taken. Eye exam is current.  Hyperlipidemia This is a chronic problem. The current episode started more than 1 year ago. Exacerbating diseases include diabetes. Pertinent negatives include no chest pain, myalgias or shortness of breath. Current antihyperlipidemic treatment includes statins. Risk factors for coronary artery disease include diabetes mellitus, dyslipidemia, hypertension, male sex and a sedentary lifestyle.  Hypertension This is a chronic problem. The current episode started more than 1 year ago. Associated symptoms include blurred vision and sweats. Pertinent negatives include no chest pain, headaches, neck pain, palpitations or shortness of breath. Risk factors  for coronary artery disease include diabetes mellitus, dyslipidemia, male gender, sedentary lifestyle and family history. Past treatments include calcium channel blockers. Hypertensive end-organ damage includes CAD/MI.    Review of systems: Limited as above.   Objective:    BP 129/73   Pulse 72   Ht 5\' 8"  (1.727 m)   Wt 175 lb (79.4 kg)   BMI 26.61 kg/m   Wt Readings from Last 3 Encounters:  11/24/18 175 lb (79.4 kg)  09/07/18 177 lb (80.3 kg)       Recent Results (from the past 2160 hour(s))  HgB A1c     Status: Abnormal   Collection Time: 11/24/18 11:33 AM  Result Value Ref Range   Hemoglobin A1C 11.0 (A) 4.0 -  5.6 %   HbA1c POC (<> result, manual entry)     HbA1c, POC (prediabetic range)     HbA1c, POC (controlled diabetic range)        Assessment & Plan:   1. DM type 2 causing vascular disease ([place])  - Dushaun Okey has currently uncontrolled symptomatic type 2 DM since  58 years of age,  with most recent A1c of 11 %. Recent labs reviewed. -He is presenting with near target glycemic profile at fasting, however significant hyperglycemia postprandially.    - I had a long discussion with him about the progressive nature of diabetes and the pathology behind its complications. -his diabetes is complicated by CAD and he remains at a high risk for more acute and chronic complications which include CAD, CVA, CKD, retinopathy, and neuropathy. These are all discussed in detail with him.  - I have counseled him on diet management adopting a carbohydrate restricted/protein rich diet. - he  admits there is a room for improvement in his diet and drink choices. -  Suggestion is made for him to avoid simple carbohydrates  from his diet including Cakes, Sweet Desserts / Pastries, Ice Cream, Soda (diet and regular), Sweet Tea, Candies, Chips, Cookies, Sweet Pastries,  Store Bought Juices, Alcohol in Excess of  1-2 drinks a day, Artificial Sweeteners, Coffee Creamer, and "Sugar-free"  Products. This will help patient to have stable blood glucose profile and potentially avoid unintended weight gain.  - I encouraged him to switch to  unprocessed or minimally processed complex starch and increased protein intake (animal or plant source), fruits, and vegetables.  - he is advised to stick to a routine mealtimes to eat 3 meals  a day and avoid unnecessary snacks ( to snack only to correct hypoglycemia).   - he will be scheduled with Jearld Fenton, RDN, CDE for diabetes education.  - I have approached him with the following individualized plan to manage  his diabetes and patient agrees:   -Patient with questionable cognitive ability, priority will be to avoid hypoglycemia in this patient.   -Ideally, he would be treated with intensive basal/bolus insulin.  However, due to his work schedule and cognitive ability this will create more stress. -He is advised to discontinue [place].  I discussed initiated NovoLog 70/30  30 units with breakfast, and 30 units with supper for premeal blood glucose readings above 90 mg per DL.  He is approached to continue monitoring blood glucose 4 times a day before meals and at bedtime and will be on telephone visit 10 days from now for reevaluation. - he is warned not to take insulin without proper monitoring per orders.  - he is encouraged to call clinic for blood glucose levels less than 70 or above 300 mg /dl. - he is advised to continue metformin 1000 mg p.o. twice daily, therapeutically suitable for patient . -He is advised to lower his glipizide to 5 mg p.o. twice daily with breakfast and supper.  - Patient specific target  A1c;  LDL, HDL, Triglycerides, and  Waist Circumference were discussed in detail.  2) Blood Pressure /Hypertension: His blood pressure is controlled to target.   he is advised to continue his current medications including amlodipine 5 mg p.o. daily with breakfast , and metoprolol 25 mg p.o. twice daily.  3)  Lipids/Hyperlipidemia: No recent lipid panel to review.  He is on atorvastatin 40 mg p.o. nightly.    he  is advised to continue , Side effects and precautions discussed  with him. He will be considered for fasting lipid panel on subsequent visit.  4)  Weight/Diet:  -He is not a candidate for weight loss measures.  He will benefit from diabetes education.  CDE Consult will be initiated . Exercise, and detailed carbohydrates information provided  -  detailed on discharge instructions.  5) Chronic Care/Health Maintenance:  -he  is on Statin medications and  is encouraged to initiate and continue to follow up with Ophthalmology, Dentist,  Podiatrist at least yearly or according to recommendations, and advised to  stay away from smoking. I have recommended yearly flu vaccine and pneumonia vaccine at least every 5 years; moderate intensity exercise for up to 150 minutes weekly; and  sleep for at least 7 hours a day.  Regarding his concern with erectile dysfunction, and his wish to be treated: He may benefit from a low-dose Cialis.  I discussed and prescribed Cialis 5 mg p.o. as needed.  I counseled him on the safe use  per prescription due to his history of coronary artery disease.  - he is  advised to maintain close follow up with Delane Ginger, Pushpinder K, NP for primary care needs, as well as his other providers for optimal and coordinated care.  - Time spent with the patient: 25 min, of which >50% was spent in reviewing his blood glucose logs , discussing his hypoglycemia and hyperglycemia episodes, reviewing his current and  previous labs / studies and medications  doses and developing a plan to avoid hypoglycemia and hyperglycemia. Please refer to Patient Instructions for Blood Glucose Monitoring and Insulin/Medications Dosing Guide"  in media tab for additional information. Please  also refer to " Patient Self Inventory" in the Media  tab for reviewed elements of pertinent patient history.  [name]  participated in the discussions, expressed understanding, and voiced agreement with the above plans.  All questions were answered to his satisfaction. he is encouraged to contact clinic should he have any questions or concerns prior to his return visit.   Follow up plan: - Return in about 10 days (around 12/04/2018) for Follow up with Meter and Logs Only - no Labs.  Marquis Lunch, MD [facility] [address] [place], [place] [address] Phone: [phone]  Fax: [phone]    11/24/2018, 12:28 PM  This note was partially dictated with voice recognition software. Similar sounding words can be transcribed inadequately or may not  be corrected upon review.

## 2018-12-08 ENCOUNTER — Other Ambulatory Visit: Payer: Self-pay

## 2018-12-08 ENCOUNTER — Ambulatory Visit: Payer: BC Managed Care – PPO | Admitting: "Endocrinology

## 2018-12-10 ENCOUNTER — Telehealth: Payer: Self-pay | Admitting: "Endocrinology

## 2018-12-10 ENCOUNTER — Other Ambulatory Visit: Payer: Self-pay

## 2018-12-10 MED ORDER — GLUCOSE BLOOD VI STRP: 1.0000 | ORAL_STRIP | 5 refills | Status: AC | PRN

## 2018-12-10 NOTE — Telephone Encounter (Signed)
Notified pt by VM

## 2018-12-10 NOTE — Telephone Encounter (Signed)
Advice patient to lower his Novolin 70/30  insulin to 20 units twice a day with breakfast and supper, keep his appointment. He missed his last appointment.

## 2018-12-10 NOTE — Telephone Encounter (Signed)
Patient, sister Amy called and said that his blood sugar was 41 after supper last night. Moved patient's apt up to Monday, 11/23 over phone. She is asking for him to have test strips called in for a meter that is called true matrix. [place]

## 2018-12-14 ENCOUNTER — Ambulatory Visit (INDEPENDENT_AMBULATORY_CARE_PROVIDER_SITE_OTHER): Payer: BC Managed Care – PPO | Admitting: "Endocrinology

## 2018-12-14 ENCOUNTER — Other Ambulatory Visit: Payer: Self-pay

## 2018-12-14 ENCOUNTER — Encounter: Payer: Self-pay | Admitting: "Endocrinology

## 2018-12-14 DIAGNOSIS — E782 Mixed hyperlipidemia: Secondary | ICD-10-CM | POA: Diagnosis not present

## 2018-12-14 DIAGNOSIS — I10 Essential (primary) hypertension: Secondary | ICD-10-CM | POA: Diagnosis not present

## 2018-12-14 DIAGNOSIS — N529 Male erectile dysfunction, unspecified: Secondary | ICD-10-CM

## 2018-12-14 DIAGNOSIS — E1159 Type 2 diabetes mellitus with other circulatory complications: Secondary | ICD-10-CM | POA: Diagnosis not present

## 2018-12-14 MED ORDER — NOVOLOG MIX 70/30 FLEXPEN (70-30) 100 UNIT/ML ~~LOC~~ SUPN: 26.0000 [IU] | PEN_INJECTOR | Freq: Two times a day (BID) | SUBCUTANEOUS | 2 refills | Status: DC

## 2018-12-14 NOTE — Progress Notes (Signed)
12/14/2018, 4:26 PM                                                      Endocrinology Telehealth Visit Follow up Note -During COVID -19 Pandemic  This visit type was conducted due to national recommendations for restrictions regarding the COVID-19 Pandemic  in an effort to limit this patient's exposure and mitigate transmission of the corona virus.  Due to his co-morbid illnesses, [name] is at  moderate to high risk for complications without adequate follow up.  This format is felt to be most appropriate for him at this time.  I connected with this patient on 12/14/2018   by telephone and verified that I am speaking with the correct person using two identifiers. Matthew Lawson, 30-Sep-1960. he has verbally consented to this visit. All issues noted in this document were discussed and addressed. The format was not optimal for physical exam.    Subjective:    Patient ID: Matthew Lawson, male    DOB: 06-Mar-1960.  Matthew Lawson is being engaged in telehealth via telephone in follow-up for management of currently uncontrolled symptomatic diabetes requested by  Alisia Ferrari, NP. He complains of erectile dysfunction.  Past Medical History:  Diagnosis Date  . Anxiety   . Depression   . Diabetes mellitus, type II (HCC)   . Heart attack (HCC)   . Hyperlipidemia   . Hypertension     Past Surgical History:  Procedure Laterality Date  . CORONARY ANGIOPLASTY WITH STENT PLACEMENT    . TONSILLECTOMY      Social History   Socioeconomic History  . Marital status: Married    Spouse name: Not on file  . Number of children: Not on file  . Years of education: Not on file  . Highest education level: Not on file  Occupational History  . Not on file  Social Needs  . Financial resource strain: Not on file  . Food insecurity    Worry: Not on file    Inability: Not on file  . Transportation needs    Medical: Not  on file    Non-medical: Not on file  Tobacco Use  . Smoking status: Never Smoker  . Smokeless tobacco: Never Used  Substance and Sexual Activity  . Alcohol use: Never    Frequency: Never  . Drug use: Never  . Sexual activity: Not on file  Lifestyle  . Physical activity    Days per week: Not on file    Minutes per session: Not on file  . Stress: Not on file  Relationships  . Social Musician on phone: Not on file    Gets together: Not on file    Attends religious service: Not on file    Active member of club or organization: Not on file    Attends meetings of clubs or organizations: Not on file    Relationship status: Not on file  Other Topics Concern  . Not on file  Social History Narrative  . Not on file    Family History  Problem Relation Age of Onset  . Diabetes Mother   . Hypertension Mother   . Hyperlipidemia Mother   . Hypertension Father   . Diabetes Father   . Hyperlipidemia Father   . CAD Father     Outpatient Encounter Medications as of 12/14/2018  Medication Sig  . amLODipine (NORVASC) 5 MG tablet daily.  Marland Kitchen aspirin EC 81 MG tablet Take 81 mg by mouth daily.  Marland Kitchen atorvastatin (LIPITOR) 40 MG tablet at bedtime.  . Cholecalciferol (VITAMIN D3) 10 MCG (400 UNIT) CAPS Take by mouth.  Marland Kitchen glipiZIDE (GLUCOTROL) 5 MG tablet Take 1 tablet (5 mg total) by mouth 2 (two) times daily before a meal.  . glucose blood test strip 1 each by Other route as needed. Use as instructed qid. E11.65 True Metrix  . insulin aspart protamine - aspart (NOVOLOG MIX 70/30 FLEXPEN) (70-30) 100 UNIT/ML FlexPen Inject 0.26 mLs (26 Units total) into the skin 2 (two) times daily.  . Magnesium 500 MG CAPS Take by mouth daily.  . metFORMIN (GLUCOPHAGE) 1000 MG tablet 2 (two) times daily.  . tadalafil (CIALIS) 5 MG tablet Take 1 tablet (5 mg total) by mouth daily as needed for erectile dysfunction.  . [DISCONTINUED] insulin aspart protamine - aspart (NOVOLOG MIX 70/30 FLEXPEN) (70-30)  100 UNIT/ML FlexPen Inject 0.3 mLs (30 Units total) into the skin 2 (two) times daily.   No facility-administered encounter medications on file as of 12/14/2018.     ALLERGIES: No Known Allergies  VACCINATION STATUS:  There is no immunization history on file for this patient.  Diabetes He presents for his follow-up diabetic visit. He has type 2 diabetes mellitus. Onset time: He was diagnosed at approximate age of 52 years. His disease course has been worsening. Pertinent negatives for hypoglycemia include no confusion, headaches, nervousness/anxiousness, pallor, seizures, sweats or tremors. Associated symptoms include polydipsia and polyuria. Pertinent negatives for diabetes include no blurred vision, no chest pain, no fatigue, no polyphagia, no weakness and no weight loss. Pertinent negatives for hypoglycemia complications include no nocturnal hypoglycemia. Symptoms are worsening. Diabetic complications include heart disease. Risk factors for coronary artery disease include diabetes mellitus, dyslipidemia, family history, hypertension and sedentary lifestyle. Current diabetic treatments: He is currently on Tresiba 40 units nightly, metformin 1000 mg p.o. twice daily, glipizide 5 mg p.o. daily. He is following a generally unhealthy diet. When asked about meal planning, he reported none. He has not had a previous visit with a dietitian. He never participates in exercise. His home blood glucose trend is decreasing steadily. His breakfast blood glucose range is generally 140-180 mg/dl. His lunch blood glucose range is generally >200 mg/dl. His dinner blood glucose range is generally 180-200 mg/dl. His overall blood glucose range is 180-200 mg/dl. (His point-of-care A1c today of 11%  ) An ACE inhibitor/angiotensin II receptor blocker is not being taken. Eye exam is current.  Hyperlipidemia This is a chronic problem. The current episode started more than 1 year ago. Exacerbating diseases include diabetes.  Pertinent negatives include no chest pain, myalgias or shortness of breath. Current antihyperlipidemic treatment includes statins. Risk factors for coronary artery disease include diabetes mellitus, dyslipidemia, hypertension, male sex and a sedentary lifestyle.  Hypertension This is a chronic problem. The current episode started more than 1 year ago. Pertinent negatives include no blurred vision, chest pain, headaches, neck pain, palpitations, shortness of  breath or sweats. Risk factors for coronary artery disease include diabetes mellitus, dyslipidemia, male gender, sedentary lifestyle and family history. Past treatments include calcium channel blockers. Hypertensive end-organ damage includes CAD/MI.    Review of systems: Limited as above.   Objective:    There were no vitals taken for this visit.  Wt Readings from Last 3 Encounters:  11/24/18 175 lb (79.4 kg)  09/07/18 177 lb (80.3 kg)       Recent Results (from the past 2160 hour(s))  HgB A1c     Status: Abnormal   Collection Time: 11/24/18 11:33 AM  Result Value Ref Range   Hemoglobin A1C 11.0 (A) 4.0 - 5.6 %   HbA1c POC (<> result, manual entry)     HbA1c, POC (prediabetic range)     HbA1c, POC (controlled diabetic range)        Assessment & Plan:   1. DM type 2 causing vascular disease (HCC)  - Matthew Critchleydwin Swatek has currently uncontrolled symptomatic type 2 DM since  58 years of age,  with most recent A1c of 11 %. Recent labs reviewed. -He reports that his glycemic profile is still above target, slightly better than last visit.   -Since his last visit, he called for a hypoglycemic episode of 41 and he was advised to lower his Novolog 70/30 to 20 units twice daily.  Since then, he does not report or document hypoglycemia.  - I had a long discussion with him about the progressive nature of diabetes and the pathology behind its complications. -his diabetes is complicated by CAD and he remains at a high risk for more acute and  chronic complications which include CAD, CVA, CKD, retinopathy, and neuropathy. These are all discussed in detail with him.  - I have counseled him on diet management adopting a carbohydrate restricted/protein rich diet.  - he  admits there is a room for improvement in his diet and drink choices. -  Suggestion is made for him to avoid simple carbohydrates  from his diet including Cakes, Sweet Desserts / Pastries, Ice Cream, Soda (diet and regular), Sweet Tea, Candies, Chips, Cookies, Sweet Pastries,  Store Bought Juices, Alcohol in Excess of  1-2 drinks a day, Artificial Sweeteners, Coffee Creamer, and "Sugar-free" Products. This will help patient to have stable blood glucose profile and potentially avoid unintended weight gain.   - I encouraged him to switch to  unprocessed or minimally processed complex starch and increased protein intake (animal or plant source), fruits, and vegetables.  - he is advised to stick to a routine mealtimes to eat 3 meals  a day and avoid unnecessary snacks ( to snack only to correct hypoglycemia).   - he will be scheduled with Norm SaltPenny Crumpton, RDN, CDE for diabetes education.  - I have approached him with the following individualized plan to manage  his diabetes and patient agrees:   -Patient with questionable cognitive ability, priority will be to avoid hypoglycemia in this patient.   -Ideally, he would be treated with intensive basal/bolus insulin.  However, due to his work schedule and cognitive ability this will create more stress. -He is advised to increase his NovoLog 70/30 to 26 units with breakfast, and 26 units with supper for premeal blood glucose readings above 90 mg per DL.  He is approached to continue monitoring blood glucose 4 times a day before meals and at bedtime and will be on telephone visit 15 days from now for reevaluation. - he is warned not to take insulin  without proper monitoring per orders.  - he is encouraged to call clinic for blood  glucose levels less than 70 or above 300 mg /dl. - he is advised to continue metformin 1000 mg p.o. twice daily, therapeutically suitable for patient . -He is advised to lower his glipizide to 5 mg p.o. twice daily with breakfast and supper.  - Patient specific target  A1c;  LDL, HDL, Triglycerides, and  Waist Circumference were discussed in detail.  2) Blood Pressure /Hypertension: he is advised to home monitor blood pressure and report if > 140/90 on 2 separate readings.    he is advised to continue his current medications including amlodipine 5 mg p.o. daily with breakfast , and metoprolol 25 mg p.o. twice daily.  3) Lipids/Hyperlipidemia: No recent lipid panel to review.  He is on atorvastatin 40 mg p.o. nightly.    he  is advised to continue , Side effects and precautions discussed with him. He will be considered for fasting lipid panel on subsequent visit.  4)  Weight/Diet:  -He is not a candidate for weight loss measures.  He will benefit from diabetes education.  CDE Consult will be initiated . Exercise, and detailed carbohydrates information provided  -  detailed on discharge instructions.  5) Chronic Care/Health Maintenance:  -he  is on Statin medications and  is encouraged to initiate and continue to follow up with Ophthalmology, Dentist,  Podiatrist at least yearly or according to recommendations, and advised to  stay away from smoking. I have recommended yearly flu vaccine and pneumonia vaccine at least every 5 years; moderate intensity exercise for up to 150 minutes weekly; and  sleep for at least 7 hours a day.  Regarding his concern with erectile dysfunction, and his wish to be treated: He may benefit from a low-dose Cialis.  I discussed and prescribed Cialis 5 mg p.o. as needed.  I counseled him on the safe use  per prescription due to his history of coronary artery disease.  - he is  advised to maintain close follow up with Gordy Levan, Pushpinder K, NP for primary care needs, as well  as his other providers for optimal and coordinated care. - Patient Care Time Today:  25 min, of which >50% was spent in  counseling and the rest reviewing his  current and  previous labs/studies, previous treatments, his blood glucose readings, and medications' doses and developing a plan for long-term care based on the latest recommendations for standards of care.   Alcide Evener participated in the discussions, expressed understanding, and voiced agreement with the above plans.  All questions were answered to his satisfaction. he is encouraged to contact clinic should he have any questions or concerns prior to his return visit.   Follow up plan: - Return in about 2 weeks (around 12/28/2018) for Follow up with Meter and Logs Only - no Labs, Include 8 log sheets.  Glade Lloyd, MD [facility] [address] [place], [place] [address] Phone: [phone]  Fax: [phone]    12/14/2018, 4:26 PM  This note was partially dictated with voice recognition software. Similar sounding words can be transcribed inadequately or may not  be corrected upon review.

## 2018-12-28 ENCOUNTER — Ambulatory Visit: Payer: BC Managed Care – PPO | Admitting: "Endocrinology

## 2018-12-31 ENCOUNTER — Ambulatory Visit (INDEPENDENT_AMBULATORY_CARE_PROVIDER_SITE_OTHER): Payer: BC Managed Care – PPO | Admitting: "Endocrinology

## 2018-12-31 ENCOUNTER — Encounter: Payer: Self-pay | Admitting: "Endocrinology

## 2018-12-31 DIAGNOSIS — E1159 Type 2 diabetes mellitus with other circulatory complications: Secondary | ICD-10-CM | POA: Diagnosis not present

## 2018-12-31 DIAGNOSIS — I10 Essential (primary) hypertension: Secondary | ICD-10-CM | POA: Diagnosis not present

## 2018-12-31 DIAGNOSIS — E782 Mixed hyperlipidemia: Secondary | ICD-10-CM | POA: Diagnosis not present

## 2018-12-31 NOTE — Progress Notes (Signed)
12/31/2018, 4:37 PM                                                      Endocrinology Telehealth Visit Follow up Note -During COVID -19 Pandemic  This visit type was conducted due to national recommendations for restrictions regarding the COVID-19 Pandemic  in an effort to limit this patient's exposure and mitigate transmission of the corona virus.  Due to his co-morbid illnesses, Matthew Lawson is at  moderate to high risk for complications without adequate follow up.  This format is felt to be most appropriate for him at this time.  I connected with this patient on 12/31/2018   by telephone and verified that I am speaking with the correct person using two identifiers. Matthew Lawson, 1960-04-02. he has verbally consented to this visit. All issues noted in this document were discussed and addressed. The format was not optimal for physical exam.    Subjective:    Patient ID: Matthew Lawson, male    DOB: 1960/04/23.  Matthew Lawson is being engaged in telehealth via telephone in follow-up for management of currently uncontrolled symptomatic type 2 diabetes requested by  Alisia Ferrari, NP.   Past Medical History:  Diagnosis Date  . Anxiety   . Depression   . Diabetes mellitus, type II (HCC)   . Heart attack (HCC)   . Hyperlipidemia   . Hypertension     Past Surgical History:  Procedure Laterality Date  . CORONARY ANGIOPLASTY WITH STENT PLACEMENT    . TONSILLECTOMY      Social History   Socioeconomic History  . Marital status: Married    Spouse name: Not on file  . Number of children: Not on file  . Years of education: Not on file  . Highest education level: Not on file  Occupational History  . Not on file  Tobacco Use  . Smoking status: Never Smoker  . Smokeless tobacco: Never Used  Substance and Sexual Activity  . Alcohol use: Never  . Drug use: Never  . Sexual activity: Not on file  Other  Topics Concern  . Not on file  Social History Narrative  . Not on file   Social Determinants of Health   Financial Resource Strain:   . Difficulty of Paying Living Expenses: Not on file  Food Insecurity:   . Worried About Programme researcher, broadcasting/film/video in the Last Year: Not on file  . Ran Out of Food in the Last Year: Not on file  Transportation Needs:   . Lack of Transportation (Medical): Not on file  . Lack of Transportation (Non-Medical): Not on file  Physical Activity:   . Days of Exercise per Week: Not on file  . Minutes of Exercise per Session: Not on file  Stress:   . Feeling of Stress : Not on file  Social Connections:   . Frequency of Communication with Friends and Family: Not on file  . Frequency  of Social Gatherings with Friends and Family: Not on file  . Attends Religious Services: Not on file  . Active Member of Clubs or Organizations: Not on file  . Attends Banker Meetings: Not on file  . Marital Status: Not on file    Family History  Problem Relation Age of Onset  . Diabetes Mother   . Hypertension Mother   . Hyperlipidemia Mother   . Hypertension Father   . Diabetes Father   . Hyperlipidemia Father   . CAD Father     Outpatient Encounter Medications as of 12/31/2018  Medication Sig  . amLODipine (NORVASC) 5 MG tablet daily.  Marland Kitchen aspirin EC 81 MG tablet Take 81 mg by mouth daily.  Marland Kitchen atorvastatin (LIPITOR) 40 MG tablet at bedtime.  . Cholecalciferol (VITAMIN D3) 10 MCG (400 UNIT) CAPS Take by mouth.  Marland Kitchen glipiZIDE (GLUCOTROL) 5 MG tablet Take 1 tablet (5 mg total) by mouth 2 (two) times daily before a meal.  . glucose blood test strip 1 each by Other route as needed. Use as instructed qid. E11.65 True Metrix  . insulin aspart protamine - aspart (NOVOLOG MIX 70/30 FLEXPEN) (70-30) 100 UNIT/ML FlexPen Inject 0.26 mLs (26 Units total) into the skin 2 (two) times daily.  . Magnesium 500 MG CAPS Take by mouth daily.  . metFORMIN (GLUCOPHAGE) 1000 MG tablet 2  (two) times daily.  . tadalafil (CIALIS) 5 MG tablet Take 1 tablet (5 mg total) by mouth daily as needed for erectile dysfunction.   No facility-administered encounter medications on file as of 12/31/2018.    ALLERGIES: No Known Allergies  VACCINATION STATUS:  There is no immunization history on file for this patient.  Diabetes He presents for his follow-up diabetic visit. He has type 2 diabetes mellitus. Onset time: He was diagnosed at approximate age of 52 years. His disease course has been improving. Pertinent negatives for hypoglycemia include no confusion, headaches, nervousness/anxiousness, pallor, seizures, sweats or tremors. Pertinent negatives for diabetes include no blurred vision, no chest pain, no fatigue, no polydipsia, no polyphagia, no weakness and no weight loss. Pertinent negatives for hypoglycemia complications include no nocturnal hypoglycemia. Symptoms are improving. Diabetic complications include heart disease. Risk factors for coronary artery disease include diabetes mellitus, dyslipidemia, family history, hypertension and sedentary lifestyle. Current diabetic treatments: He is currently on Tresiba 40 units nightly, metformin 1000 mg p.o. twice daily, glipizide 5 mg p.o. daily. He is following a generally unhealthy diet. When asked about meal planning, he reported none. He has not had a previous visit with a dietitian. He never participates in exercise. His home blood glucose trend is decreasing steadily. His breakfast blood glucose range is generally 140-180 mg/dl. His dinner blood glucose range is generally 180-200 mg/dl. His overall blood glucose range is 140-180 mg/dl. (His point-of-care A1c today of 11%  ) An ACE inhibitor/angiotensin II receptor blocker is not being taken. Eye exam is current.  Hyperlipidemia This is a chronic problem. The current episode started more than 1 year ago. Exacerbating diseases include diabetes. Pertinent negatives include no chest pain,  myalgias or shortness of breath. Current antihyperlipidemic treatment includes statins. Risk factors for coronary artery disease include diabetes mellitus, dyslipidemia, hypertension, male sex and a sedentary lifestyle.  Hypertension This is a chronic problem. The current episode started more than 1 year ago. Pertinent negatives include no blurred vision, chest pain, headaches, neck pain, palpitations, shortness of breath or sweats. Risk factors for coronary artery disease include diabetes mellitus, dyslipidemia, male  gender, sedentary lifestyle and family history. Past treatments include calcium channel blockers. Hypertensive end-organ damage includes CAD/MI.    Review of systems: Limited as above.   Objective:    There were no vitals taken for this visit.  Wt Readings from Last 3 Encounters:  11/24/18 175 lb (79.4 kg)  09/07/18 177 lb (80.3 kg)       Recent Results (from the past 2160 hour(s))  HgB A1c     Status: Abnormal   Collection Time: 11/24/18 11:33 AM  Result Value Ref Range   Hemoglobin A1C 11.0 (A) 4.0 - 5.6 %   HbA1c POC (<> result, manual entry)     HbA1c, POC (prediabetic range)     HbA1c, POC (controlled diabetic range)        Assessment & Plan:   1. DM type 2 causing vascular disease ([place])  - Matthew Lawson has currently uncontrolled symptomatic type 2 DM since  58 years of age. -He reports improved glycemic profile, 77-259 fasting, 184-223 at supper.  His recent A1c was 11%.   Recent labs reviewed. He denies hypoglycemia since last visit.  - I had a long discussion with him about the progressive nature of diabetes and the pathology behind its complications. -his diabetes is complicated by CAD and he remains at a high risk for more acute and chronic complications which include CAD, CVA, CKD, retinopathy, and neuropathy. These are all discussed in detail with him.  - I have counseled him on diet management adopting a carbohydrate restricted/protein rich  diet.  - he  admits there is a room for improvement in his diet and drink choices. -  Suggestion is made for him to avoid simple carbohydrates  from his diet including Cakes, Sweet Desserts / Pastries, Ice Cream, Soda (diet and regular), Sweet Tea, Candies, Chips, Cookies, Sweet Pastries,  Store Bought Juices, Alcohol in Excess of  1-2 drinks a day, Artificial Sweeteners, Coffee Creamer, and "Sugar-free" Products. This will help patient to have stable blood glucose profile and potentially avoid unintended weight gain.  - I encouraged him to switch to  unprocessed or minimally processed complex starch and increased protein intake (animal or plant source), fruits, and vegetables.  - he is advised to stick to a routine mealtimes to eat 3 meals  a day and avoid unnecessary snacks ( to snack only to correct hypoglycemia).   - he will be scheduled with Jearld Fenton, RDN, CDE for diabetes education.  - I have approached him with the following individualized plan to manage  his diabetes and patient agrees:   -Patient with questionable cognitive ability, priority will be to avoid hypoglycemia in this patient.    -Ideally, he would be treated with intensive basal/bolus insulin.  However, due to his work schedule and cognitive ability this will create more stress. -He is advised to continue NovoLog 70/30  26 units with breakfast, and 26 units with supper for premeal blood glucose readings above 90 mg per DL.  He is approached to continue monitoring blood glucose 4 times a day before meals and at bedtime . - he is warned not to take insulin without proper monitoring per orders.  - he is encouraged to call clinic for blood glucose levels less than 70 or above 300 mg /dl. - he is advised to continue Metformin 1000 mg p.o. twice daily-daily after breakfast and after supper.   -He is advised to continue glipizide 5 mg p.o. twice daily with breakfast and supper.   - Patient  specific target  A1c;  LDL, HDL,  Triglycerides, and  Waist Circumference were discussed in detail.  2) Blood Pressure /Hypertension:he is advised to home monitor blood pressure and report if > 140/90 on 2 separate readings.   he is advised to continue his current medications including amlodipine 5 mg p.o. daily with breakfast , and metoprolol 25 mg p.o. twice daily.  3) Lipids/Hyperlipidemia: No recent lipid panel to review.  He is on atorvastatin 40 mg p.o. nightly .    he  is advised to continue , Side effects and precautions discussed with him. He will be considered for fasting lipid panel on subsequent visit.  4)  Weight/Diet:  -He is not a candidate for weight loss measures.  He will benefit from diabetes education.  CDE Consult will be initiated . Exercise, and detailed carbohydrates information provided  -  detailed on discharge instructions.  5) Chronic Care/Health Maintenance:  -he  is on Statin medications and  is encouraged to initiate and continue to follow up with Ophthalmology, Dentist,  Podiatrist at least yearly or according to recommendations, and advised to  stay away from smoking. I have recommended yearly flu vaccine and pneumonia vaccine at least every 5 years; moderate intensity exercise for up to 150 minutes weekly; and  sleep for at least 7 hours a day.  Regarding his concern with erectile dysfunction, and his wish to be treated: He may benefit from a low-dose Cialis.  I discussed and prescribed Cialis 5 mg p.o. as needed.  I counseled him on the safe use  per prescription due to his history of coronary artery disease.  - he is  advised to maintain close follow up with Matthew Lawson, Matthew K, NP for primary care needs, as well as his other providers for optimal and coordinated care.  - Patient Care Time Today:  25 min, of which >50% was spent in  counseling and the rest reviewing his  current and  previous labs/studies, previous treatments, his blood glucose readings, and medications' doses and developing a  plan for long-term care based on the latest recommendations for standards of care.   Matthew CritchleyEdwin Lawson participated in the discussions, expressed understanding, and voiced agreement with the above plans.  All questions were answered to his satisfaction. he is encouraged to contact clinic should he have any questions or concerns prior to his return visit.   Follow up plan: - Return in about 3 months (around 03/31/2019) for Bring Meter and Logs- A1c in Office, Include 8 log sheets.  Matthew LunchGebre Elon Lomeli, MD [facility] [address] [place], [place] [address] Phone: [phone]  Fax: [phone]    12/31/2018, 4:37 PM  This note was partially dictated with voice recognition software. Similar sounding words can be transcribed inadequately or may not  be corrected upon review.

## 2019-03-01 ENCOUNTER — Other Ambulatory Visit: Payer: Self-pay | Admitting: "Endocrinology

## 2019-03-08 ENCOUNTER — Other Ambulatory Visit: Payer: Self-pay | Admitting: "Endocrinology

## 2019-04-01 ENCOUNTER — Other Ambulatory Visit: Payer: Self-pay

## 2019-04-01 ENCOUNTER — Encounter: Payer: Self-pay | Admitting: "Endocrinology

## 2019-04-01 ENCOUNTER — Ambulatory Visit (INDEPENDENT_AMBULATORY_CARE_PROVIDER_SITE_OTHER): Payer: BC Managed Care – PPO | Admitting: "Endocrinology

## 2019-04-01 VITALS — BP 111/72 | HR 76 | Ht 68.0 in | Wt 178.6 lb

## 2019-04-01 DIAGNOSIS — E782 Mixed hyperlipidemia: Secondary | ICD-10-CM

## 2019-04-01 DIAGNOSIS — I10 Essential (primary) hypertension: Secondary | ICD-10-CM | POA: Diagnosis not present

## 2019-04-01 DIAGNOSIS — N529 Male erectile dysfunction, unspecified: Secondary | ICD-10-CM | POA: Diagnosis not present

## 2019-04-01 DIAGNOSIS — E1159 Type 2 diabetes mellitus with other circulatory complications: Secondary | ICD-10-CM | POA: Diagnosis not present

## 2019-04-01 LAB — POCT GLYCOSYLATED HEMOGLOBIN (HGB A1C): Hemoglobin A1C: 9.9 % — AB (ref 4.0–5.6)

## 2019-04-01 MED ORDER — NOVOLOG MIX 70/30 FLEXPEN (70-30) 100 UNIT/ML ~~LOC~~ SUPN: 30.0000 [IU] | PEN_INJECTOR | Freq: Two times a day (BID) | SUBCUTANEOUS | 2 refills | Status: DC

## 2019-04-01 MED ORDER — FREESTYLE LIBRE 14 DAY SENSOR MISC: 1.0000 | 2 refills | Status: DC

## 2019-04-01 MED ORDER — FREESTYLE LIBRE 14 DAY READER DEVI: 1.0000 | Freq: Once | 0 refills | Status: AC

## 2019-04-01 NOTE — Patient Instructions (Signed)

## 2019-04-01 NOTE — Progress Notes (Signed)
04/01/2019, 4:56 PM  Endocrinology follow-up note   Subjective:    Patient ID: Matthew Lawson, male    DOB: [birth year].  Matthew Lawson is being seen in follow-up for management of currently uncontrolled symptomatic type 2 diabetes requested by  Alisia Ferrari, NP.   Past Medical History:  Diagnosis Date  . Anxiety   . Depression   . Diabetes mellitus, type II (HCC)   . Heart attack (HCC)   . Hyperlipidemia   . Hypertension     Past Surgical History:  Procedure Laterality Date  . CORONARY ANGIOPLASTY WITH STENT PLACEMENT    . TONSILLECTOMY      Social History   Socioeconomic History  . Marital status: Married    Spouse name: Not on file  . Number of children: Not on file  . Years of education: Not on file  . Highest education level: Not on file  Occupational History  . Not on file  Tobacco Use  . Smoking status: Never Smoker  . Smokeless tobacco: Never Used  Substance and Sexual Activity  . Alcohol use: Never  . Drug use: Never  . Sexual activity: Not on file  Other Topics Concern  . Not on file  Social History Narrative  . Not on file   Social Determinants of Health   Financial Resource Strain:   . Difficulty of Paying Living Expenses:   Food Insecurity:   . Worried About Programme researcher, broadcasting/film/video in the Last Year:   . Barista in the Last Year:   Transportation Needs:   . Freight forwarder (Medical):   Marland Kitchen Lack of Transportation (Non-Medical):   Physical Activity:   . Days of Exercise per Week:   . Minutes of Exercise per Session:   Stress:   . Feeling of Stress :   Social Connections:   . Frequency of Communication with Friends and Family:   . Frequency of Social Gatherings with Friends and Family:   . Attends Religious Services:   . Active Member of Clubs or Organizations:   . Attends Banker Meetings:   Marland Kitchen Marital Status:     Family History   Problem Relation Age of Onset  . Diabetes Mother   . Hypertension Mother   . Hyperlipidemia Mother   . Hypertension Father   . Diabetes Father   . Hyperlipidemia Father   . CAD Father     Outpatient Encounter Medications as of 04/01/2019  Medication Sig  . amLODipine (NORVASC) 5 MG tablet daily.  Marland Kitchen aspirin EC 81 MG tablet Take 81 mg by mouth daily.  Marland Kitchen atorvastatin (LIPITOR) 40 MG tablet at bedtime.  . Continuous Blood Gluc Receiver (FREESTYLE LIBRE 14 DAY READER) DEVI 1 each by Does not apply route once for 1 dose.  . Continuous Blood Gluc Sensor (FREESTYLE LIBRE 14 DAY SENSOR) MISC Inject 1 each into the skin every 14 (fourteen) days. Use as directed.  Marland Kitchen glipiZIDE (GLUCOTROL) 5 MG tablet TAKE (1) TABLET BY MOUTH TWICE A DAY BEFORE MEALS. (BREAKFAST AND SUPPER)  . glucose blood test strip 1 each by Other  route as needed. Use as instructed qid. E11.65 True Metrix  . insulin aspart protamine - aspart (NOVOLOG MIX 70/30 FLEXPEN) (70-30) 100 UNIT/ML FlexPen Inject 0.3 mLs (30 Units total) into the skin 2 (two) times daily with a meal.  . metFORMIN (GLUCOPHAGE) 1000 MG tablet 2 (two) times daily.  . tadalafil (CIALIS) 5 MG tablet Take 1 tablet (5 mg total) by mouth daily as needed for erectile dysfunction.  . [DISCONTINUED] Cholecalciferol (VITAMIN D3) 10 MCG (400 UNIT) CAPS Take by mouth.  . [DISCONTINUED] Magnesium 500 MG CAPS Take by mouth daily.  . [DISCONTINUED] NOVOLOG MIX 70/30 FLEXPEN (70-30) 100 UNIT/ML FlexPen INJECT 26 UNITS INTO THE SKIN TWICE DAILY.   No facility-administered encounter medications on file as of 04/01/2019.    ALLERGIES: No Known Allergies  VACCINATION STATUS:  There is no immunization history on file for this patient.  Diabetes He presents for his follow-up diabetic visit. He has type 2 diabetes mellitus. Onset time: He was diagnosed at approximate age of 59 years. His disease course has been improving. Pertinent negatives for hypoglycemia include no  confusion, headaches, nervousness/anxiousness, pallor, seizures, sweats or tremors. Pertinent negatives for diabetes include no blurred vision, no chest pain, no fatigue, no polydipsia, no polyphagia, no weakness and no weight loss. Pertinent negatives for hypoglycemia complications include no nocturnal hypoglycemia. Symptoms are improving. Diabetic complications include heart disease. Risk factors for coronary artery disease include diabetes mellitus, dyslipidemia, family history, hypertension and sedentary lifestyle. Current diabetic treatments: He is currently on Tresiba 40 units nightly, metformin 1000 mg p.o. twice daily, glipizide 5 mg p.o. daily. His weight is fluctuating minimally. He is following a generally unhealthy diet. When asked about meal planning, he reported none. He has not had a previous visit with a dietitian. He never participates in exercise. His home blood glucose trend is decreasing steadily. His breakfast blood glucose range is generally 180-200 mg/dl. His dinner blood glucose range is generally 180-200 mg/dl. His bedtime blood glucose range is generally 180-200 mg/dl. His overall blood glucose range is 180-200 mg/dl. (He presents with slight improvement in his glycemic profile, point-of-care A1c is 9.9% improving from 11%.   He did not document no report hypoglycemia. ) An ACE inhibitor/angiotensin II receptor blocker is not being taken. Eye exam is current.  Hyperlipidemia This is a chronic problem. The current episode started more than 1 year ago. Exacerbating diseases include diabetes. Pertinent negatives include no chest pain, myalgias or shortness of breath. Current antihyperlipidemic treatment includes statins. Risk factors for coronary artery disease include diabetes mellitus, dyslipidemia, hypertension, male sex and a sedentary lifestyle.  Hypertension This is a chronic problem. The current episode started more than 1 year ago. Pertinent negatives include no blurred vision,  chest pain, headaches, neck pain, palpitations, shortness of breath or sweats. Risk factors for coronary artery disease include diabetes mellitus, dyslipidemia, male gender, sedentary lifestyle and family history. Past treatments include calcium channel blockers. Hypertensive end-organ damage includes CAD/MI.     Review of systems  Constitutional: + Minimally fluctuating body weight,  current  Body mass index is 27.16 kg/m. , no fatigue, no subjective hyperthermia, no subjective hypothermia Eyes: no blurry vision, no xerophthalmia ENT: no sore throat, no nodules palpated in throat, no dysphagia/odynophagia, no hoarseness Cardiovascular: no Chest Pain, no Shortness of Breath, no palpitations, no leg swelling Respiratory: no cough, no shortness of breath Gastrointestinal: no Nausea/Vomiting/Diarhhea Musculoskeletal: no muscle/joint aches Skin: no rashes, no hyperemia Neurological: no tremors, no numbness, no tingling, no dizziness Psychiatric:  no depression, no anxiety   Objective:    BP 111/72   Pulse 76   Ht 5\' 8"  (1.727 m)   Wt 178 lb 9.6 oz (81 kg)   BMI 27.16 kg/m   Wt Readings from Last 3 Encounters:  04/01/19 178 lb 9.6 oz (81 kg)  11/24/18 175 lb (79.4 kg)  09/07/18 177 lb (80.3 kg)     Physical Exam- Limited  Constitutional:  Body mass index is 27.16 kg/m. , not in acute distress, normal state of mind Eyes:  EOMI, no exophthalmos Neck: Supple Thyroid: No gross goiter Respiratory: Adequate breathing efforts Musculoskeletal: no gross deformities, strength intact in all four extremities, no gross restriction of joint movements Skin:  no rashes, no hyperemia Neurological: no tremor with outstretched hands,     Recent Results (from the past 2160 hour(s))  HgB A1c     Status: Abnormal   Collection Time: 04/01/19  4:00 PM  Result Value Ref Range   Hemoglobin A1C 9.9 (A) 4.0 - 5.6 %   HbA1c POC (<> result, manual entry)     HbA1c, POC (prediabetic range)      HbA1c, POC (controlled diabetic range)        Assessment & Plan:   1. DM type 2 causing vascular disease (HCC)  - Veron Senner has currently uncontrolled symptomatic type 2 DM since  59 years of age. He presents with slight improvement in his glycemic profile, point-of-care A1c is 9.9% improving from 11%.   He did not document no report hypoglycemia.  Recent labs reviewed. He denies hypoglycemia since last visit.  - I had a long discussion with him about the progressive nature of diabetes and the pathology behind its complications. -his diabetes is complicated by CAD and he remains at a high risk for more acute and chronic complications which include CAD, CVA, CKD, retinopathy, and neuropathy. These are all discussed in detail with him.  - I have counseled him on diet management adopting a carbohydrate restricted/protein rich diet.  - he  admits there is a room for improvement in his diet and drink choices. -  Suggestion is made for him to avoid simple carbohydrates  from his diet including Cakes, Sweet Desserts / Pastries, Ice Cream, Soda (diet and regular), Sweet Tea, Candies, Chips, Cookies, Sweet Pastries,  Store Bought Juices, Alcohol in Excess of  1-2 drinks a day, Artificial Sweeteners, Coffee Creamer, and "Sugar-free" Products. This will help patient to have stable blood glucose profile and potentially avoid unintended weight gain.   - I encouraged him to switch to  unprocessed or minimally processed complex starch and increased protein intake (animal or plant source), fruits, and vegetables.  - he is advised to stick to a routine mealtimes to eat 3 meals  a day and avoid unnecessary snacks ( to snack only to correct hypoglycemia).   - he will be scheduled with 44, RDN, CDE for diabetes education.  - I have approached him with the following individualized plan to manage  his diabetes and patient agrees:   -Patient with questionable cognitive ability, priority will  be to avoid hypoglycemia in this patient.    -Ideally, he would be treated with intensive basal/bolus insulin.  However, due to his work schedule and cognitive ability this will create more stress. -He is advised to continue NovoLog 70/30 increased to 30 units with breakfast, and 30 units with supper  for premeal blood glucose readings above 90 mg per DL.  He is approached to  continue monitoring blood glucose 4 times a day before meals and at bedtime . - he is warned not to take insulin without proper monitoring per orders.  - he is encouraged to call clinic for blood glucose levels less than 70 or above 300 mg /dl. - he is advised to continue Metformin 1000 mg p.o. twice daily-daily after breakfast and after supper.   -He is advised to continue glipizide 5 mg p.o. twice daily with breakfast and supper.   - Patient specific target  A1c;  LDL, HDL, Triglycerides, and  Waist Circumference were discussed in detail.  2) Blood Pressure /Hypertension: His blood pressure is controlled to target.   he is advised to continue his current medications including amlodipine 5 mg p.o. daily with breakfast , and metoprolol 25 mg p.o. twice daily.  3) Lipids/Hyperlipidemia: No recent lipid panel to review.  He is on atorvastatin 40 mg p.o. nightly.     he  is advised to continue , Side effects and precautions discussed with him. He will be considered for fasting lipid panel on subsequent visit.  4)  Weight/Diet: His BMI is 27-He is not a candidate for weight loss measures.  He will benefit from diabetes education.  CDE Consult will be initiated . Exercise, and detailed carbohydrates information provided  -  detailed on discharge instructions.  5) Chronic Care/Health Maintenance:  -he  is on Statin medications and  is encouraged to initiate and continue to follow up with Ophthalmology, Dentist,  Podiatrist at least yearly or according to recommendations, and advised to  stay away from smoking. I have recommended  yearly flu vaccine and pneumonia vaccine at least every 5 years; moderate intensity exercise for up to 150 minutes weekly; and  sleep for at least 7 hours a day.    - he is  advised to maintain close follow up with Alisia Ferrari, NP for primary care needs, as well as his other providers for optimal and coordinated care.  - Time spent on this patient care encounter:  35 min, of which > 50% was spent in  counseling and the rest reviewing his blood glucose logs , discussing his hypoglycemia and hyperglycemia episodes, reviewing his current and  previous labs / studies  ( including abstraction from other facilities) and medications  doses and developing a  long term treatment plan and documenting his care.   Please refer to Patient Instructions for Blood Glucose Monitoring and Insulin/Medications Dosing Guide"  in media tab for additional information. Please  also refer to " Patient Self Inventory" in the Media  tab for reviewed elements of pertinent patient history.  [name] participated in the discussions, expressed understanding, and voiced agreement with the above plans.  All questions were answered to his satisfaction. he is encouraged to contact clinic should he have any questions or concerns prior to his return visit.   Follow up plan: - Return in about 3 months (around 07/02/2019) for Bring Meter and Logs- A1c in Office, Follow up with Pre-visit Labs.  Marquis Lunch, MD [facility] [address] [place], [place] [address] Phone: [phone]  Fax: [phone]    04/01/2019, 4:56 PM  This note was partially dictated with voice recognition software. Similar sounding words can be transcribed inadequately or may not  be corrected upon review.

## 2019-04-02 ENCOUNTER — Other Ambulatory Visit: Payer: Self-pay

## 2019-04-02 MED ORDER — FREESTYLE LIBRE 14 DAY SENSOR MISC: 1.0000 | 2 refills | Status: AC

## 2019-04-02 MED ORDER — FREESTYLE LIBRE 14 DAY READER DEVI: 1.0000 | Freq: Four times a day (QID) | 0 refills | Status: AC

## 2019-05-27 LAB — COMPREHENSIVE METABOLIC PANEL
Calcium: 8.9 (ref 8.7–10.7)
GFR calc Af Amer: 112
GFR calc non Af Amer: 97

## 2019-05-27 LAB — BASIC METABOLIC PANEL
BUN: 13 (ref 4–21)
Creatinine: 0.9 (ref 0.6–1.3)

## 2019-05-27 LAB — HEMOGLOBIN A1C: Hemoglobin A1C: 10.2

## 2019-05-28 LAB — LIPID PANEL
Cholesterol: 113 (ref 0–200)
HDL: 56 (ref 35–70)
LDL Cholesterol: 46
Triglycerides: 46 (ref 40–160)

## 2019-05-31 ENCOUNTER — Other Ambulatory Visit: Payer: Self-pay | Admitting: "Endocrinology

## 2019-07-05 ENCOUNTER — Other Ambulatory Visit: Payer: Self-pay | Admitting: "Endocrinology

## 2019-07-06 ENCOUNTER — Ambulatory Visit: Payer: BC Managed Care – PPO | Admitting: "Endocrinology

## 2020-08-24 ENCOUNTER — Ambulatory Visit: Payer: BC Managed Care – PPO

## 2020-08-24 ENCOUNTER — Other Ambulatory Visit: Payer: Self-pay

## 2020-08-24 ENCOUNTER — Ambulatory Visit (INDEPENDENT_AMBULATORY_CARE_PROVIDER_SITE_OTHER): Payer: BC Managed Care – PPO | Admitting: Orthopaedic Surgery

## 2020-08-24 ENCOUNTER — Encounter: Payer: Self-pay | Admitting: Orthopaedic Surgery

## 2020-08-24 VITALS — BP 129/79 | HR 56 | Ht 67.0 in | Wt 184.2 lb

## 2020-08-24 DIAGNOSIS — M25512 Pain in left shoulder: Secondary | ICD-10-CM | POA: Diagnosis not present

## 2020-08-24 MED ORDER — NAPROXEN 500 MG PO TABS
500.0000 mg | ORAL_TABLET | Freq: Two times a day (BID) | ORAL | 5 refills | Status: AC
Start: 2020-08-24 — End: ?

## 2020-08-24 NOTE — Progress Notes (Signed)
Subjective:    Patient ID: Matthew Lawson, male    DOB: 25-Jun-1960, 60 y.o.   MRN: [id number]  HPI He has had pain and tenderness of the left shoulder for over a month. He has pain raising his left shoulder, pain sleeping on it.  He has no trauma,no redness, no numbness.  He has tried ice, heat, Tylenol with no help.  He is tired of it hurting.   Review of Systems  Constitutional:  Positive for activity change.  Musculoskeletal:  Positive for arthralgias.  All other systems reviewed and are negative. For Review of Systems, all other systems reviewed and are negative.  The following is a summary of the past history medically, past history surgically, known current medicines, social history and family history.  This information is gathered electronically by the computer from prior information and documentation.  I review this each visit and have found including this information at this point in the chart is beneficial and informative.   Past Medical History:  Diagnosis Date   Anxiety    Depression    Diabetes mellitus, type II (HCC)    Heart attack (HCC)    Hyperlipidemia    Hypertension     Past Surgical History:  Procedure Laterality Date   CORONARY ANGIOPLASTY WITH STENT PLACEMENT     TONSILLECTOMY      Current Outpatient Medications on File Prior to Visit  Medication Sig Dispense Refill   amLODipine (NORVASC) 5 MG tablet daily.     aspirin EC 81 MG tablet Take 81 mg by mouth daily.     atorvastatin (LIPITOR) 40 MG tablet at bedtime.     Continuous Blood Gluc Receiver (FREESTYLE LIBRE 14 DAY READER) DEVI 1 each by Does not apply route 4 (four) times daily. 1 each 0   Continuous Blood Gluc Sensor (FREESTYLE LIBRE 14 DAY SENSOR) MISC Inject 1 each into the skin every 14 (fourteen) days. Use as directed. 2 each 2   escitalopram (LEXAPRO) 20 MG tablet Take 20 mg by mouth daily.     FARXIGA 5 MG TABS tablet Take 5 mg by mouth daily.     glucose blood test strip 1 each by Other  route as needed. Use as instructed qid. E11.65 True Metrix 150 each 5   nitroGLYCERIN (NITROSTAT) 0.4 MG SL tablet Place 0.4 mg under the tongue as directed.     NOVOLOG FLEXPEN 100 UNIT/ML FlexPen Inject 12 Units into the skin 3 (three) times daily with meals.     SEMGLEE, YFGN, 100 UNIT/ML Pen Inject 36 Units into the skin at bedtime.     No current facility-administered medications on file prior to visit.    Social History   Socioeconomic History   Marital status: Married    Spouse name: Not on file   Number of children: Not on file   Years of education: Not on file   Highest education level: Not on file  Occupational History   Not on file  Tobacco Use   Smoking status: Never   Smokeless tobacco: Never  Substance and Sexual Activity   Alcohol use: Never   Drug use: Never   Sexual activity: Not on file  Other Topics Concern   Not on file  Social History Narrative   Not on file   Social Determinants of Health   Financial Resource Strain: Not on file  Food Insecurity: Not on file  Transportation Needs: Not on file  Physical Activity: Not on file  Stress: Not on  file  Social Connections: Not on file  Intimate Partner Violence: Not on file    Family History  Problem Relation Age of Onset   Diabetes Mother    Hypertension Mother    Hyperlipidemia Mother    Hypertension Father    Diabetes Father    Hyperlipidemia Father    CAD Father     BP 129/79   Pulse (!) 56   Ht 5\' 7"  (1.702 m)   Wt 184 lb 3.2 oz (83.6 kg)   BMI 28.85 kg/m   Body mass index is 28.85 kg/m.     Objective:   Physical Exam Vitals and nursing note reviewed. Exam conducted with a chaperone present.  Constitutional:      Appearance: He is well-developed.  HENT:     Head: Normocephalic and atraumatic.  Eyes:     Conjunctiva/sclera: Conjunctivae normal.     Pupils: Pupils are equal, round, and reactive to light.  Cardiovascular:     Rate and Rhythm: Normal rate and regular rhythm.   Pulmonary:     Effort: Pulmonary effort is normal.  Abdominal:     Palpations: Abdomen is soft.  Musculoskeletal:       Arms:     Cervical back: Normal range of motion and neck supple.  Skin:    General: Skin is warm and dry.  Neurological:     Mental Status: He is alert and oriented to person, place, and time.     Cranial Nerves: No cranial nerve deficit.     Motor: No abnormal muscle tone.     Coordination: Coordination normal.     Deep Tendon Reflexes: Reflexes are normal and symmetric. Reflexes normal.  Psychiatric:        Behavior: Behavior normal.        Thought Content: Thought content normal.        Judgment: Judgment normal.  X-rays were done of the left shoulder, reported separately.        Assessment & Plan:   Encounter Diagnosis  Name Primary?   Acute pain of left shoulder Yes   PROCEDURE NOTE:  The patient request injection, verbal consent was obtained.  The left shoulder was prepped appropriately after time out was performed.   Sterile technique was observed and injection of 1 cc of Celestone 6 mg with several cc's of plain xylocaine. Anesthesia was provided by ethyl chloride and a 20-gauge needle was used to inject the shoulder area. A posterior approach was used.  The injection was tolerated well.  A band aid dressing was applied.  The patient was advised to apply ice later today and tomorrow to the injection sight as needed.   Begin Naprosyn.  Return in two weeks.  He may need MRI if not improved.  Call if any problem.  Precautions discussed.  Electronically Signed , MD 8/4/202211:32 AM

## 2020-09-07 ENCOUNTER — Ambulatory Visit: Payer: BC Managed Care – PPO | Admitting: Orthopaedic Surgery

## 2020-09-07 ENCOUNTER — Encounter: Payer: Self-pay | Admitting: Orthopaedic Surgery

## 2020-09-07 ENCOUNTER — Other Ambulatory Visit: Payer: Self-pay

## 2020-09-07 VITALS — BP 120/70 | HR 55 | Ht 68.0 in | Wt 188.8 lb

## 2020-09-07 DIAGNOSIS — M25512 Pain in left shoulder: Secondary | ICD-10-CM | POA: Diagnosis not present

## 2020-09-07 NOTE — Progress Notes (Signed)
My shoulder is a little better  He has less pain in the left shoulder and some more motion but it still hurts.  He has no new trauma.  He has no numbness.  ROM is good with pain in the extremes.  NV intact.  Encounter Diagnosis  Name Primary?   Acute pain of left shoulder Yes   I will begin PT for the shoulder in [place].  Return in three weeks.  If not improved, get MRI.  Call if any problem.  Precautions discussed.  Electronically Signed Darreld Mclean, MD 8/18/20228:49 AM

## 2020-09-07 NOTE — Addendum Note (Signed)
Addended by: Cherre Huger E on: 09/07/2020 09:27 AM   Modules accepted: Orders

## 2020-09-19 ENCOUNTER — Ambulatory Visit: Payer: BC Managed Care – PPO | Admitting: Orthopaedic Surgery

## 2020-09-28 ENCOUNTER — Ambulatory Visit: Payer: BC Managed Care – PPO | Admitting: Orthopaedic Surgery

## 2020-10-19 ENCOUNTER — Other Ambulatory Visit: Payer: Self-pay

## 2020-10-19 ENCOUNTER — Ambulatory Visit: Payer: BC Managed Care – PPO | Admitting: Orthopaedic Surgery

## 2020-10-19 ENCOUNTER — Encounter: Payer: Self-pay | Admitting: Orthopaedic Surgery

## 2020-10-19 VITALS — BP 128/73 | HR 59 | Ht 68.0 in | Wt 187.6 lb

## 2020-10-19 DIAGNOSIS — M25512 Pain in left shoulder: Secondary | ICD-10-CM | POA: Diagnosis not present

## 2020-10-19 DIAGNOSIS — G8929 Other chronic pain: Secondary | ICD-10-CM | POA: Diagnosis not present

## 2020-10-19 NOTE — Addendum Note (Signed)
Addended by: Cherre Huger E on: 10/19/2020 08:56 AM   Modules accepted: Orders

## 2020-10-19 NOTE — Progress Notes (Signed)
My shoulder is no better.  He went to PT and is no better with the left shoulder.  He still has pain.  He has no new trauma.  I have reviewed the PT notes.    He has full ROM of the left shoulder with pain in the extremes.  NV intact.  Grips normal.  Encounter Diagnosis  Name Primary?   Chronic pain in left shoulder Yes   I will get MRI of the left shoulder. I am concerned about rotator cuff tear.  Return in three weeks.  Stop PT.  Call if any problem.  Precautions discussed.  Electronically Signed Darreld Mclean, MD 9/29/20228:18 AM

## 2020-11-09 ENCOUNTER — Ambulatory Visit: Payer: BC Managed Care – PPO | Admitting: Orthopaedic Surgery

## 2020-11-17 ENCOUNTER — Other Ambulatory Visit: Payer: Self-pay

## 2020-11-17 ENCOUNTER — Ambulatory Visit (HOSPITAL_COMMUNITY)
Admission: RE | Admit: 2020-11-17 | Discharge: 2020-11-17 | Disposition: A | Discharge status: Home / Self Care | Payer: BC Managed Care – PPO | Source: Ambulatory Visit | Attending: Orthopaedic Surgery | Admitting: Orthopaedic Surgery

## 2020-11-17 DIAGNOSIS — M25512 Pain in left shoulder: Secondary | ICD-10-CM | POA: Diagnosis present

## 2020-11-17 DIAGNOSIS — G8929 Other chronic pain: Secondary | ICD-10-CM | POA: Diagnosis present

## 2020-11-23 ENCOUNTER — Ambulatory Visit: Payer: BC Managed Care – PPO | Admitting: Orthopaedic Surgery

## 2020-11-23 ENCOUNTER — Other Ambulatory Visit: Payer: Self-pay

## 2020-11-23 ENCOUNTER — Encounter: Payer: Self-pay | Admitting: Orthopaedic Surgery

## 2020-11-23 DIAGNOSIS — M25512 Pain in left shoulder: Secondary | ICD-10-CM | POA: Diagnosis not present

## 2020-11-23 DIAGNOSIS — G8929 Other chronic pain: Secondary | ICD-10-CM

## 2020-11-23 NOTE — Patient Instructions (Signed)
Return to clinic to see Dr. Cairns °

## 2020-11-23 NOTE — Progress Notes (Signed)
My shoulder still hurts.  He had the MRI of the left shoulder showing: IMPRESSION: 1. 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 2. Moderate tendinosis of the infraspinatus tendon.  I have explained the findings to him.  He has tried conservative therapy with little help.  I will have him see Dr. Dallas Schimke for further evaluation.  Patient is agreeable.  ROM of the left shoulder is full but pain in the extremes.  NV intact.  I have independently reviewed the MRI.    Encounter Diagnosis  Name Primary?   Chronic pain in left shoulder Yes   To see Dr. Dallas Schimke.  Call if any problem.  Precautions discussed.  Electronically Signed Darreld Mclean, MD 11/3/20229:02 AM

## 2020-11-29 ENCOUNTER — Other Ambulatory Visit: Payer: Self-pay

## 2020-11-29 ENCOUNTER — Encounter: Payer: Self-pay | Admitting: Orthopedic Surgery

## 2020-11-29 ENCOUNTER — Ambulatory Visit: Payer: BC Managed Care – PPO | Admitting: Orthopedic Surgery

## 2020-11-29 VITALS — BP 141/80 | HR 63 | Ht 68.0 in | Wt 187.0 lb

## 2020-11-29 DIAGNOSIS — M75112 Incomplete rotator cuff tear or rupture of left shoulder, not specified as traumatic: Secondary | ICD-10-CM | POA: Diagnosis not present

## 2020-11-29 NOTE — Patient Instructions (Addendum)
We will place an order for an XR guided left shoulder injection  Pay attention to how much relief you get from the injection, and for how long  Please contact the clinic if you need anything.  We will not schedule a follow up appointment, but we are available if you have any issues   Central Scheduling [phone]

## 2020-11-29 NOTE — Progress Notes (Signed)
New Patient Visit  Assessment: Matthew Lawson is a 60 y.o. male with the following: 1. Chronic pain in left shoulder; partial-thickness rotator cuff tear evidence on MRI.  He also has pain at the [facility] joint.  Plan: Reviewed the results of the MRI with the patient in clinic today.  He has pain at the [facility] joint, as well as some pain consistent with irritation of the rotator cuff tendons.  He has previously had a subacromial steroid injection, with limited sustained improvement.  At this point, I am concerned that a potential surgery could make his pain and range of motion worse.  As a result, I recommended an intra-articular glenohumeral steroid joint injection, under fluoroscopic guidance.  He is interested in this procedure.  We will request this injection be done, and see him as needed.  Pending the improvements witnessed following the injection, we may consider a surgery in the future.  All questions were answered and he is amenable to this plan.   Follow-up: Return if symptoms worsen or fail to improve.  Subjective:  Chief Complaint  Patient presents with   Consultation    LT shoulder/Dr.Keeling patient    History of Present Illness: Matthew Lawson is a 60 y.o. RHD male who presents for evaluation of left shoulder pain.  He has had progressively worsening pain in his left shoulder for several months.  He has previously been evaluated by Dr. Hilda Lias.  He had a subacromial steroid injection, with limited improvement.  He comes today with an MRI demonstrating a partial-thickness injury to his rotator cuff, as well as some AC joint degenerative changes.  Pain is in the superior aspect of his shoulder.  He also has pain in the lateral aspect of his shoulder, which worsens at night.  The pain in the lateral shoulder radiates distally towards his elbow.  He has tried exercises for her shoulder, without improvement.   Review of Systems: No fevers or chills No numbness or tingling No chest pain No  shortness of breath No bowel or bladder dysfunction No GI distress No headaches   Medical History:  Past Medical History:  Diagnosis Date   Anxiety    Depression    Diabetes mellitus, type II (HCC)    Heart attack (HCC)    Hyperlipidemia    Hypertension     Past Surgical History:  Procedure Laterality Date   CORONARY ANGIOPLASTY WITH STENT PLACEMENT     TONSILLECTOMY      Family History  Problem Relation Age of Onset   Diabetes Mother    Hypertension Mother    Hyperlipidemia Mother    Hypertension Father    Diabetes Father    Hyperlipidemia Father    CAD Father    Social History   Tobacco Use   Smoking status: Never   Smokeless tobacco: Never  Substance Use Topics   Alcohol use: Never   Drug use: Never    No Known Allergies  Current Meds  Medication Sig   amLODipine (NORVASC) 5 MG tablet daily.   aspirin EC 81 MG tablet Take 81 mg by mouth daily.   atorvastatin (LIPITOR) 40 MG tablet at bedtime.   Continuous Blood Gluc Receiver (FREESTYLE LIBRE 14 DAY READER) DEVI 1 each by Does not apply route 4 (four) times daily.   Continuous Blood Gluc Sensor (FREESTYLE LIBRE 14 DAY SENSOR) MISC Inject 1 each into the skin every 14 (fourteen) days. Use as directed.   escitalopram (LEXAPRO) 20 MG tablet Take 20 mg by mouth  daily.   FARXIGA 5 MG TABS tablet Take 5 mg by mouth daily.   glucose blood test strip 1 each by Other route as needed. Use as instructed qid. E11.65 True Metrix   naproxen (NAPROSYN) 500 MG tablet Take 1 tablet (500 mg total) by mouth 2 (two) times daily with a meal.   nitroGLYCERIN (NITROSTAT) 0.4 MG SL tablet Place 0.4 mg under the tongue as directed.   NOVOLOG FLEXPEN 100 UNIT/ML FlexPen Inject 12 Units into the skin 3 (three) times daily with meals.   SEMGLEE, YFGN, 100 UNIT/ML Pen Inject 36 Units into the skin at bedtime.    Objective: BP (!) 141/80   Pulse 63   Ht 5\' 8"  (1.727 m)   Wt 187 lb (84.8 kg)   BMI 28.43 kg/m   Physical  Exam:  General: Alert and oriented. and No acute distress. Gait: Normal gait.  Evaluation of left shoulder demonstrates no deformity.  No atrophy is appreciated over the posterior aspect of the shoulder.  He does have some tenderness to palpation at the [facility] joint.  Slightly restricted range of motion, due to pain.  He notes some discomfort in the shoulder with strength testing, which is essentially normal.  Fingers are warm and well-perfused.  He has pain with internal rotation to his lumbar spine.   IMAGING: I personally reviewed images previously obtained in clinic  X-ray of the left shoulder is without acute injury.  Narrowing of the [facility] joint, with some osteophytes.  Glenohumeral joint is reduced.  MRI of the left shoulder IMPRESSION: 1. 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 2. Moderate tendinosis of the infraspinatus tendon.  New Medications:  No orders of the defined types were placed in this encounter.     [facility], MD  11/29/2020 1:09 PM

## 2020-12-18 ENCOUNTER — Ambulatory Visit (HOSPITAL_COMMUNITY)
Admission: RE | Admit: 2020-12-18 | Discharge: 2020-12-18 | Disposition: A | Discharge status: Home / Self Care | Payer: BC Managed Care – PPO | Source: Ambulatory Visit | Attending: Orthopedic Surgery | Admitting: Orthopedic Surgery

## 2020-12-18 ENCOUNTER — Other Ambulatory Visit: Payer: Self-pay

## 2020-12-18 ENCOUNTER — Encounter (HOSPITAL_COMMUNITY): Payer: Self-pay

## 2020-12-18 DIAGNOSIS — M75112 Incomplete rotator cuff tear or rupture of left shoulder, not specified as traumatic: Secondary | ICD-10-CM | POA: Diagnosis present

## 2020-12-18 MED ORDER — LIDOCAINE HCL (PF) 1 % IJ SOLN
INTRAMUSCULAR | Status: AC
  Administered 2020-12-18: 09:00:00 5 mL
  Filled 2020-12-18: qty 5

## 2020-12-18 MED ORDER — LIDOCAINE HCL (PF) 1 % IJ SOLN
INTRAMUSCULAR | Status: AC
  Filled 2020-12-18: qty 5

## 2020-12-18 MED ORDER — IOHEXOL 180 MG/ML  SOLN
20.0000 mL | Freq: Once | INTRAMUSCULAR | Status: AC | PRN
  Administered 2020-12-18: 09:00:00 3 mL via INTRA_ARTICULAR

## 2020-12-18 MED ORDER — METHYLPREDNISOLONE ACETATE 40 MG/ML IJ SUSP
INTRAMUSCULAR | Status: AC
  Administered 2020-12-18: 09:00:00 40 mg
  Filled 2020-12-18: qty 1

## 2020-12-18 MED ORDER — BUPIVACAINE HCL (PF) 0.5 % IJ SOLN
INTRAMUSCULAR | Status: AC
  Administered 2020-12-18: 09:00:00 3 mL
  Filled 2020-12-18: qty 30

## 2020-12-18 NOTE — Procedures (Signed)
Preprocedure Dx: LEFT shoulder pain, RCT Postprocedure Dx: LEFT shoulder pain, RCT Procedure  Fluoroscopically guided LT shoulder joint injection ftherapeutic Radiologist:  Tyron Russell Anesthesia:  2 ml of 1% lidocaine Injectate:  40mg  depomedrol, 3 ml Sensorcaine 0.5% Fluoro time:  1 minutes 18 seconds EBL:   None Complications: None

## 2021-04-23 ENCOUNTER — Telehealth: Payer: Self-pay | Admitting: Orthopaedic Surgery

## 2021-04-23 NOTE — Telephone Encounter (Signed)
Patient called asking about how he may obtain his Xray and MRI information and films that he had done through our office, for purpose of his relocating. Provided patient with Radiology phone number [phone] to request; aware to call back if any questions. ?

## 2022-11-13 ENCOUNTER — Emergency Department (HOSPITAL_COMMUNITY)
Admission: EM | Admit: 2022-11-13 | Discharge: 2022-11-13 | Disposition: A | Discharge status: Home / Self Care | Payer: BC Managed Care – PPO

## 2023-04-01 IMAGING — RF DG FLUORO GUIDE NDL PLC/BX
2 series · 5 of 5 positions shown · non-contrast
Comparison: none

CLINICAL DATA: Partial nontraumatic rupture of LEFT rotator cuff,
LEFT shoulder pain

EXAM:
THERAPEUTIC LEFT SHOULDER INJECTION UNDER FLUOROSCOPY
TECHNIQUE: Procedure, risks, benefits and alternatives explained to the
patient.

[Series 1: cp_standard · 0.18mm/px · 1 of 1 slices shown (1 of 2)]
[im 1/1]
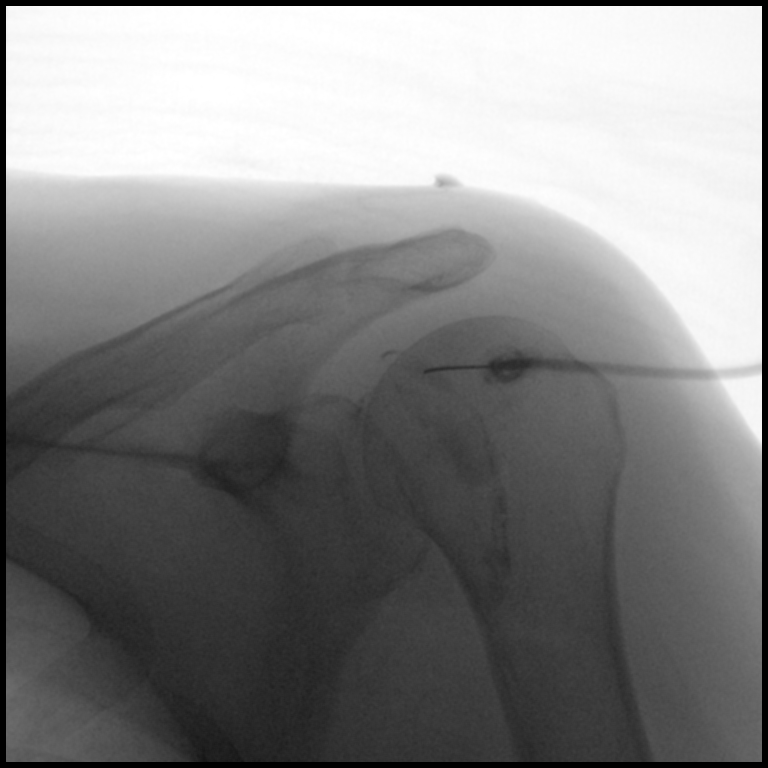

[Series 2: cp_standard · 0.18mm/px · 4 of 46 frames shown (2 of 2)]
[frame 7/46]
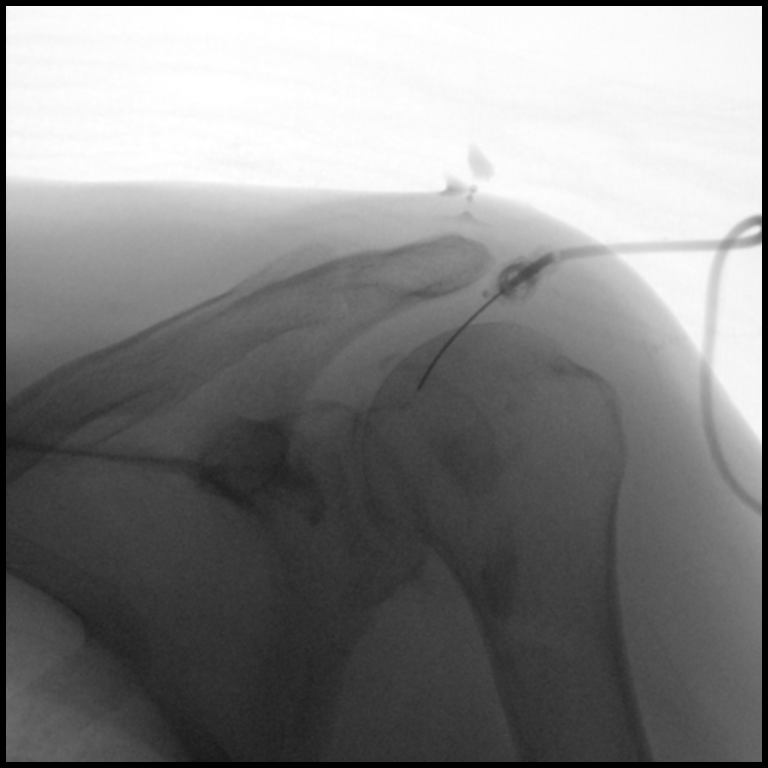
[frame 10/46]
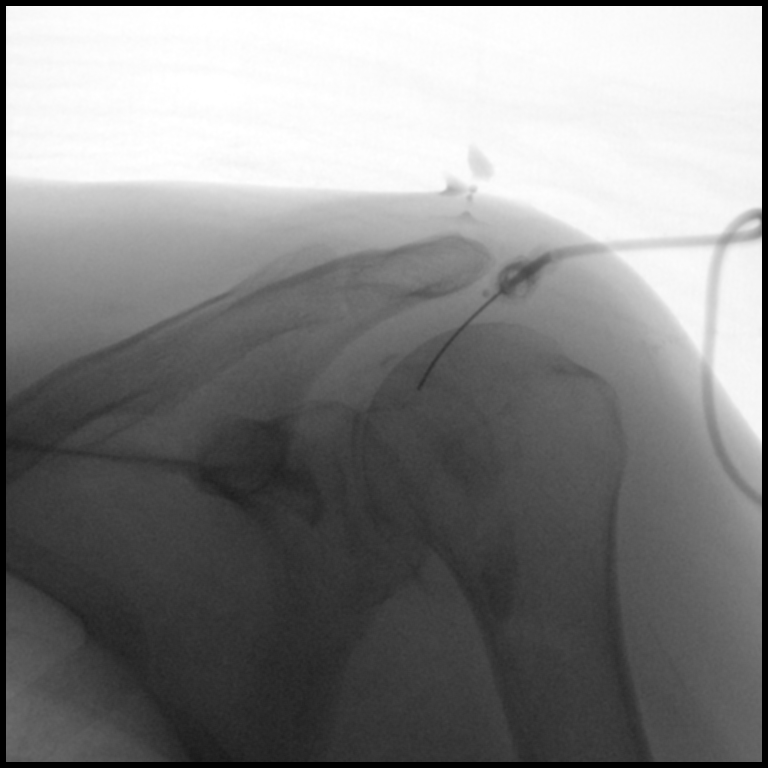
[frame 24/46]
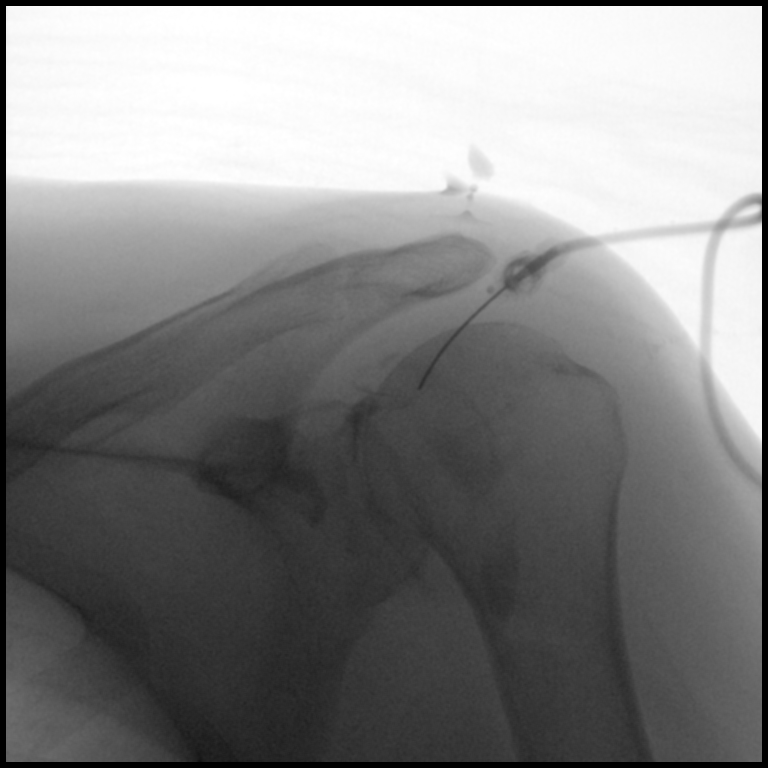
[frame 40/46]
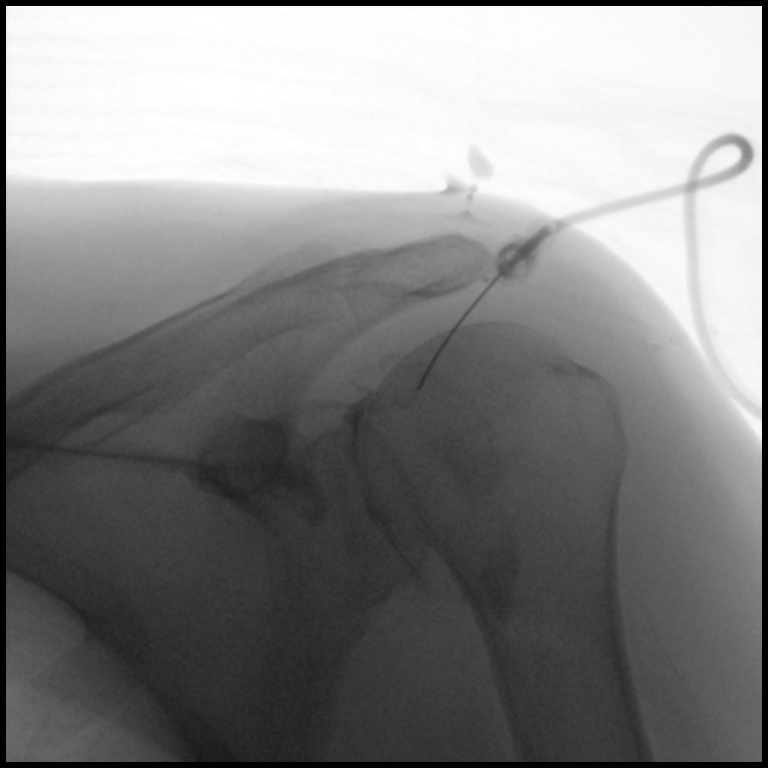

[5 of 5 positions shown; findings below may reference images not displayed]

Patient's questions answered.

Written informed consent obtained.

Timeout protocol followed.

LEFT shoulder joint localized by fluoroscopy.

Skin prepped and draped in usual sterile fashion.

Skin and soft tissues anesthetized with 2 mL of 1% lidocaine.

Under fluoroscopic guidance, 22 gauge spinal needle was advanced
into LEFT shoulder joint.

Intra-articular position of needle tip confirmed with 3 mL of
Omnipaque 180.

Patient was then injected with 40 mg Depo-Medrol and 3 mL of
Sensorcaine 0.5%.

Procedure tolerated well by patient without immediate complication.

FLUOROSCOPY TIME:  Fluoroscopy Time:  1 minute 18 seconds

Radiation Exposure Index (if provided by the fluoroscopic device):
2.4 mGy

Number of Acquired Spot Images: 2
FINDINGS: As above
IMPRESSION: Technically successful therapeutic LEFT shoulder injection.
